# Patient Record
Sex: Male | Born: 1970
Health system: Southern US, Community
[De-identification: ages and names within clinical notes are randomized; demographics above are authoritative.]

## PROBLEM LIST (undated history)

## (undated) DIAGNOSIS — J189 Pneumonia, unspecified organism: Secondary | ICD-10-CM

## (undated) DIAGNOSIS — S065X9A Traumatic subdural hemorrhage with loss of consciousness of unspecified duration, initial encounter: Secondary | ICD-10-CM

## (undated) DIAGNOSIS — F1110 Opioid abuse, uncomplicated: Secondary | ICD-10-CM

## (undated) DIAGNOSIS — Z9289 Personal history of other medical treatment: Secondary | ICD-10-CM

## (undated) DIAGNOSIS — S065XAA Traumatic subdural hemorrhage with loss of consciousness status unknown, initial encounter: Secondary | ICD-10-CM

## (undated) DIAGNOSIS — K922 Gastrointestinal hemorrhage, unspecified: Secondary | ICD-10-CM

## (undated) DIAGNOSIS — J45909 Unspecified asthma, uncomplicated: Secondary | ICD-10-CM

## (undated) DIAGNOSIS — D649 Anemia, unspecified: Secondary | ICD-10-CM

## (undated) DIAGNOSIS — N200 Calculus of kidney: Secondary | ICD-10-CM

## (undated) DIAGNOSIS — M549 Dorsalgia, unspecified: Secondary | ICD-10-CM

## (undated) DIAGNOSIS — G8929 Other chronic pain: Secondary | ICD-10-CM

## (undated) DIAGNOSIS — L409 Psoriasis, unspecified: Secondary | ICD-10-CM

## (undated) DIAGNOSIS — Z8719 Personal history of other diseases of the digestive system: Secondary | ICD-10-CM

## (undated) DIAGNOSIS — Z87442 Personal history of urinary calculi: Secondary | ICD-10-CM

## (undated) DIAGNOSIS — J939 Pneumothorax, unspecified: Secondary | ICD-10-CM

## (undated) DIAGNOSIS — M1990 Unspecified osteoarthritis, unspecified site: Secondary | ICD-10-CM

## (undated) DIAGNOSIS — F1410 Cocaine abuse, uncomplicated: Secondary | ICD-10-CM

## (undated) DIAGNOSIS — F419 Anxiety disorder, unspecified: Secondary | ICD-10-CM

## (undated) DIAGNOSIS — K219 Gastro-esophageal reflux disease without esophagitis: Secondary | ICD-10-CM

## (undated) HISTORY — PX: LEG SURGERY: SHX1003

## (undated) HISTORY — PX: ORIF ELBOW FRACTURE: SUR928

## (undated) HISTORY — PX: ORIF CLAVICULAR FRACTURE: SHX5055

## (undated) HISTORY — PX: TIBIA FRACTURE SURGERY: SHX806

## (undated) HISTORY — PX: JOINT REPLACEMENT: SHX530

## (undated) HISTORY — PX: COLONOSCOPY WITH ESOPHAGOGASTRODUODENOSCOPY (EGD): SHX5779

## (undated) HISTORY — PX: CARPAL TUNNEL RELEASE: SHX101

---

## 1998-12-30 DIAGNOSIS — J939 Pneumothorax, unspecified: Secondary | ICD-10-CM

## 1998-12-30 DIAGNOSIS — IMO0002 Reserved for concepts with insufficient information to code with codable children: Secondary | ICD-10-CM | POA: Insufficient documentation

## 1998-12-30 HISTORY — DX: Pneumothorax, unspecified: J93.9

## 1999-06-23 ENCOUNTER — Encounter: Payer: Self-pay | Admitting: Emergency Medicine

## 1999-06-23 ENCOUNTER — Encounter: Payer: Self-pay | Admitting: General Surgery

## 1999-06-23 ENCOUNTER — Inpatient Hospital Stay (HOSPITAL_COMMUNITY): Admission: EM | Admit: 1999-06-23 | Discharge: 1999-07-24 | Payer: Self-pay | Admitting: Emergency Medicine

## 1999-06-24 ENCOUNTER — Encounter: Payer: Self-pay | Admitting: Orthopedic Surgery

## 1999-06-24 ENCOUNTER — Encounter: Payer: Self-pay | Admitting: General Surgery

## 1999-06-25 ENCOUNTER — Encounter: Payer: Self-pay | Admitting: General Surgery

## 1999-06-26 ENCOUNTER — Encounter: Payer: Self-pay | Admitting: General Surgery

## 1999-06-27 ENCOUNTER — Encounter: Payer: Self-pay | Admitting: General Surgery

## 1999-06-28 ENCOUNTER — Encounter: Payer: Self-pay | Admitting: General Surgery

## 1999-06-29 ENCOUNTER — Encounter: Payer: Self-pay | Admitting: General Surgery

## 1999-07-01 ENCOUNTER — Encounter: Payer: Self-pay | Admitting: General Surgery

## 1999-07-02 ENCOUNTER — Encounter: Payer: Self-pay | Admitting: General Surgery

## 1999-07-03 ENCOUNTER — Encounter: Payer: Self-pay | Admitting: General Surgery

## 1999-07-04 ENCOUNTER — Encounter: Payer: Self-pay | Admitting: General Surgery

## 1999-07-05 ENCOUNTER — Encounter: Payer: Self-pay | Admitting: General Surgery

## 1999-07-06 ENCOUNTER — Encounter: Payer: Self-pay | Admitting: General Surgery

## 1999-07-07 ENCOUNTER — Encounter: Payer: Self-pay | Admitting: General Surgery

## 1999-07-08 ENCOUNTER — Encounter: Payer: Self-pay | Admitting: Thoracic Surgery (Cardiothoracic Vascular Surgery)

## 1999-07-09 ENCOUNTER — Encounter: Payer: Self-pay | Admitting: Thoracic Surgery (Cardiothoracic Vascular Surgery)

## 1999-07-10 ENCOUNTER — Encounter: Payer: Self-pay | Admitting: Thoracic Surgery (Cardiothoracic Vascular Surgery)

## 1999-07-11 ENCOUNTER — Encounter: Payer: Self-pay | Admitting: Thoracic Surgery (Cardiothoracic Vascular Surgery)

## 1999-07-12 ENCOUNTER — Encounter: Payer: Self-pay | Admitting: General Surgery

## 1999-07-14 ENCOUNTER — Encounter: Payer: Self-pay | Admitting: General Surgery

## 1999-07-15 ENCOUNTER — Encounter: Payer: Self-pay | Admitting: Surgery

## 1999-07-16 ENCOUNTER — Encounter: Payer: Self-pay | Admitting: General Surgery

## 1999-07-16 ENCOUNTER — Encounter: Payer: Self-pay | Admitting: Orthopedic Surgery

## 1999-07-22 ENCOUNTER — Encounter: Payer: Self-pay | Admitting: Orthopedic Surgery

## 1999-07-24 ENCOUNTER — Encounter: Payer: Self-pay | Admitting: General Surgery

## 1999-07-30 ENCOUNTER — Ambulatory Visit (HOSPITAL_COMMUNITY): Admission: RE | Admit: 1999-07-30 | Discharge: 1999-07-30 | Payer: Self-pay

## 1999-08-08 ENCOUNTER — Encounter: Admission: RE | Admit: 1999-08-08 | Discharge: 1999-09-27 | Payer: Self-pay | Admitting: Orthopedic Surgery

## 1999-09-10 ENCOUNTER — Encounter: Payer: Self-pay | Admitting: Orthopedic Surgery

## 1999-09-11 ENCOUNTER — Encounter: Payer: Self-pay | Admitting: Orthopedic Surgery

## 1999-09-11 ENCOUNTER — Ambulatory Visit (HOSPITAL_COMMUNITY): Admission: RE | Admit: 1999-09-11 | Discharge: 1999-09-11 | Payer: Self-pay | Admitting: Orthopedic Surgery

## 1999-11-27 ENCOUNTER — Encounter: Payer: Self-pay | Admitting: General Surgery

## 1999-11-27 ENCOUNTER — Ambulatory Visit (HOSPITAL_COMMUNITY): Admission: RE | Admit: 1999-11-27 | Discharge: 1999-11-27 | Payer: Self-pay | Admitting: General Surgery

## 2000-02-19 ENCOUNTER — Encounter: Admission: RE | Admit: 2000-02-19 | Discharge: 2000-05-19 | Payer: Self-pay | Admitting: Orthopedic Surgery

## 2000-04-22 ENCOUNTER — Emergency Department (HOSPITAL_COMMUNITY): Admission: EM | Admit: 2000-04-22 | Discharge: 2000-04-22 | Payer: Self-pay | Admitting: Emergency Medicine

## 2000-04-22 ENCOUNTER — Encounter: Payer: Self-pay | Admitting: Emergency Medicine

## 2000-06-10 ENCOUNTER — Ambulatory Visit (HOSPITAL_BASED_OUTPATIENT_CLINIC_OR_DEPARTMENT_OTHER): Admission: RE | Admit: 2000-06-10 | Discharge: 2000-06-10 | Payer: Self-pay | Admitting: Pediatrics

## 2001-01-06 ENCOUNTER — Encounter: Payer: Self-pay | Admitting: Emergency Medicine

## 2001-01-06 ENCOUNTER — Emergency Department (HOSPITAL_COMMUNITY): Admission: EM | Admit: 2001-01-06 | Discharge: 2001-01-06 | Payer: Self-pay | Admitting: Emergency Medicine

## 2001-09-19 ENCOUNTER — Ambulatory Visit (HOSPITAL_COMMUNITY): Admission: RE | Admit: 2001-09-19 | Discharge: 2001-09-19 | Payer: Self-pay | Admitting: Orthopedic Surgery

## 2001-09-19 ENCOUNTER — Encounter: Payer: Self-pay | Admitting: Orthopedic Surgery

## 2002-06-29 ENCOUNTER — Inpatient Hospital Stay (HOSPITAL_COMMUNITY): Admission: RE | Admit: 2002-06-29 | Discharge: 2002-07-01 | Payer: Self-pay | Admitting: Orthopedic Surgery

## 2003-05-29 ENCOUNTER — Emergency Department (HOSPITAL_COMMUNITY): Admission: EM | Admit: 2003-05-29 | Discharge: 2003-05-29 | Payer: Self-pay | Admitting: Emergency Medicine

## 2003-12-31 DIAGNOSIS — Z96659 Presence of unspecified artificial knee joint: Secondary | ICD-10-CM | POA: Insufficient documentation

## 2004-02-06 ENCOUNTER — Emergency Department (HOSPITAL_COMMUNITY): Admission: EM | Admit: 2004-02-06 | Discharge: 2004-02-06 | Payer: Self-pay | Admitting: Emergency Medicine

## 2004-02-16 ENCOUNTER — Inpatient Hospital Stay (HOSPITAL_COMMUNITY): Admission: RE | Admit: 2004-02-16 | Discharge: 2004-02-20 | Payer: Self-pay | Admitting: Orthopedic Surgery

## 2004-10-29 ENCOUNTER — Emergency Department (HOSPITAL_COMMUNITY): Admission: EM | Admit: 2004-10-29 | Discharge: 2004-10-29 | Payer: Self-pay | Admitting: Family Medicine

## 2004-11-07 ENCOUNTER — Emergency Department (HOSPITAL_COMMUNITY): Admission: EM | Admit: 2004-11-07 | Discharge: 2004-11-07 | Payer: Self-pay | Admitting: Emergency Medicine

## 2004-11-11 ENCOUNTER — Emergency Department (HOSPITAL_COMMUNITY): Admission: EM | Admit: 2004-11-11 | Discharge: 2004-11-11 | Payer: Self-pay | Admitting: Emergency Medicine

## 2004-12-02 ENCOUNTER — Emergency Department (HOSPITAL_COMMUNITY): Admission: EM | Admit: 2004-12-02 | Discharge: 2004-12-02 | Payer: Self-pay

## 2004-12-04 ENCOUNTER — Emergency Department (HOSPITAL_COMMUNITY): Admission: EM | Admit: 2004-12-04 | Discharge: 2004-12-04 | Payer: Self-pay | Admitting: Emergency Medicine

## 2004-12-06 ENCOUNTER — Emergency Department (HOSPITAL_COMMUNITY): Admission: EM | Admit: 2004-12-06 | Discharge: 2004-12-07 | Payer: Self-pay | Admitting: Emergency Medicine

## 2004-12-07 ENCOUNTER — Emergency Department (HOSPITAL_COMMUNITY): Admission: EM | Admit: 2004-12-07 | Discharge: 2004-12-07 | Payer: Self-pay | Admitting: Emergency Medicine

## 2004-12-20 ENCOUNTER — Emergency Department (HOSPITAL_COMMUNITY): Admission: EM | Admit: 2004-12-20 | Discharge: 2004-12-20 | Payer: Self-pay | Admitting: Emergency Medicine

## 2005-01-07 ENCOUNTER — Ambulatory Visit: Payer: Self-pay | Admitting: Orthopedic Surgery

## 2005-03-10 ENCOUNTER — Emergency Department (HOSPITAL_COMMUNITY): Admission: EM | Admit: 2005-03-10 | Discharge: 2005-03-10 | Payer: Self-pay | Admitting: Emergency Medicine

## 2005-03-21 ENCOUNTER — Ambulatory Visit: Payer: Self-pay | Admitting: Orthopedic Surgery

## 2005-03-25 ENCOUNTER — Encounter
Admission: RE | Admit: 2005-03-25 | Discharge: 2005-06-23 | Payer: Self-pay | Admitting: Physical Medicine & Rehabilitation

## 2005-04-03 ENCOUNTER — Emergency Department (HOSPITAL_COMMUNITY): Admission: EM | Admit: 2005-04-03 | Discharge: 2005-04-03 | Payer: Self-pay | Admitting: Emergency Medicine

## 2005-04-07 ENCOUNTER — Emergency Department (HOSPITAL_COMMUNITY): Admission: EM | Admit: 2005-04-07 | Discharge: 2005-04-07 | Payer: Self-pay | Admitting: Emergency Medicine

## 2005-06-13 ENCOUNTER — Emergency Department (HOSPITAL_COMMUNITY): Admission: EM | Admit: 2005-06-13 | Discharge: 2005-06-13 | Payer: Self-pay | Admitting: *Deleted

## 2005-10-15 ENCOUNTER — Emergency Department (HOSPITAL_COMMUNITY): Admission: EM | Admit: 2005-10-15 | Discharge: 2005-10-15 | Payer: Self-pay | Admitting: Emergency Medicine

## 2006-03-03 ENCOUNTER — Emergency Department (HOSPITAL_COMMUNITY): Admission: EM | Admit: 2006-03-03 | Discharge: 2006-03-03 | Payer: Self-pay | Admitting: Emergency Medicine

## 2006-06-04 ENCOUNTER — Inpatient Hospital Stay (HOSPITAL_COMMUNITY): Admission: EM | Admit: 2006-06-04 | Discharge: 2006-06-09 | Payer: Self-pay | Admitting: Psychiatry

## 2006-06-04 ENCOUNTER — Ambulatory Visit: Payer: Self-pay | Admitting: Psychiatry

## 2006-08-01 ENCOUNTER — Emergency Department (HOSPITAL_COMMUNITY): Admission: EM | Admit: 2006-08-01 | Discharge: 2006-08-02 | Payer: Self-pay | Admitting: Emergency Medicine

## 2006-11-03 ENCOUNTER — Emergency Department (HOSPITAL_COMMUNITY): Admission: EM | Admit: 2006-11-03 | Discharge: 2006-11-04 | Payer: Self-pay | Admitting: Emergency Medicine

## 2008-07-26 ENCOUNTER — Emergency Department (HOSPITAL_COMMUNITY): Admission: EM | Admit: 2008-07-26 | Discharge: 2008-07-26 | Payer: Self-pay | Admitting: Emergency Medicine

## 2008-08-01 ENCOUNTER — Emergency Department (HOSPITAL_COMMUNITY): Admission: EM | Admit: 2008-08-01 | Discharge: 2008-08-01 | Payer: Self-pay | Admitting: Emergency Medicine

## 2008-08-12 ENCOUNTER — Ambulatory Visit: Payer: Self-pay | Admitting: Nurse Practitioner

## 2008-08-12 DIAGNOSIS — Z9889 Other specified postprocedural states: Secondary | ICD-10-CM | POA: Insufficient documentation

## 2008-08-12 DIAGNOSIS — K219 Gastro-esophageal reflux disease without esophagitis: Secondary | ICD-10-CM

## 2008-08-12 DIAGNOSIS — M25569 Pain in unspecified knee: Secondary | ICD-10-CM

## 2008-08-12 DIAGNOSIS — L259 Unspecified contact dermatitis, unspecified cause: Secondary | ICD-10-CM

## 2008-08-12 DIAGNOSIS — J45909 Unspecified asthma, uncomplicated: Secondary | ICD-10-CM | POA: Insufficient documentation

## 2008-08-13 ENCOUNTER — Encounter (INDEPENDENT_AMBULATORY_CARE_PROVIDER_SITE_OTHER): Payer: Self-pay | Admitting: Nurse Practitioner

## 2008-08-14 ENCOUNTER — Emergency Department (HOSPITAL_COMMUNITY): Admission: EM | Admit: 2008-08-14 | Discharge: 2008-08-14 | Payer: Self-pay | Admitting: Emergency Medicine

## 2008-08-15 ENCOUNTER — Ambulatory Visit: Payer: Self-pay | Admitting: *Deleted

## 2008-08-15 ENCOUNTER — Encounter (INDEPENDENT_AMBULATORY_CARE_PROVIDER_SITE_OTHER): Payer: Self-pay | Admitting: Nurse Practitioner

## 2008-08-15 LAB — CONVERTED CEMR LAB
Albumin: 4.6 g/dL (ref 3.5–5.2)
CO2: 24 meq/L (ref 19–32)
Chloride: 103 meq/L (ref 96–112)
Glucose, Bld: 92 mg/dL (ref 70–99)
HCT: 49.1 % (ref 39.0–52.0)
Lymphocytes Relative: 29 % (ref 12–46)
Lymphs Abs: 2.1 10*3/uL (ref 0.7–4.0)
Neutrophils Relative %: 62 % (ref 43–77)
Platelets: 313 10*3/uL (ref 150–400)
Potassium: 4.6 meq/L (ref 3.5–5.3)
Sodium: 141 meq/L (ref 135–145)
TSH: 0.326 microintl units/mL — ABNORMAL LOW (ref 0.350–4.50)
Total Protein: 7.7 g/dL (ref 6.0–8.3)
WBC: 7.2 10*3/uL (ref 4.0–10.5)

## 2008-08-16 ENCOUNTER — Telehealth (INDEPENDENT_AMBULATORY_CARE_PROVIDER_SITE_OTHER): Payer: Self-pay | Admitting: Nurse Practitioner

## 2008-08-16 LAB — CONVERTED CEMR LAB: Free T4: 1.3 ng/dL (ref 0.89–1.80)

## 2008-08-23 ENCOUNTER — Ambulatory Visit: Payer: Self-pay | Admitting: Internal Medicine

## 2008-08-23 DIAGNOSIS — Z87442 Personal history of urinary calculi: Secondary | ICD-10-CM

## 2008-08-23 DIAGNOSIS — R634 Abnormal weight loss: Secondary | ICD-10-CM

## 2008-08-23 DIAGNOSIS — M1990 Unspecified osteoarthritis, unspecified site: Secondary | ICD-10-CM | POA: Insufficient documentation

## 2008-08-23 LAB — CONVERTED CEMR LAB
Alkaline Phosphatase: 36 units/L — ABNORMAL LOW (ref 39–117)
Bilirubin, Direct: 0.1 mg/dL (ref 0.0–0.3)
GFR calc Af Amer: 97 mL/min
Glucose, Bld: 95 mg/dL (ref 70–99)
Lymphocytes Relative: 36.8 % (ref 12.0–46.0)
Monocytes Absolute: 0.6 10*3/uL (ref 0.1–1.0)
Monocytes Relative: 9.4 % (ref 3.0–12.0)
Platelets: 371 10*3/uL (ref 150–400)
Potassium: 3.7 meq/L (ref 3.5–5.1)
RDW: 12.1 % (ref 11.5–14.6)
Sodium: 142 meq/L (ref 135–145)
Total Bilirubin: 0.7 mg/dL (ref 0.3–1.2)
Total Protein: 7.9 g/dL (ref 6.0–8.3)

## 2008-08-25 ENCOUNTER — Encounter: Payer: Self-pay | Admitting: Internal Medicine

## 2008-08-25 ENCOUNTER — Encounter (INDEPENDENT_AMBULATORY_CARE_PROVIDER_SITE_OTHER): Payer: Self-pay | Admitting: Nurse Practitioner

## 2008-08-28 ENCOUNTER — Emergency Department (HOSPITAL_COMMUNITY): Admission: EM | Admit: 2008-08-28 | Discharge: 2008-08-28 | Payer: Self-pay | Admitting: Emergency Medicine

## 2008-09-04 ENCOUNTER — Ambulatory Visit: Payer: Self-pay | Admitting: *Deleted

## 2008-09-04 ENCOUNTER — Emergency Department (HOSPITAL_COMMUNITY): Admission: EM | Admit: 2008-09-04 | Discharge: 2008-09-04 | Payer: Self-pay | Admitting: Emergency Medicine

## 2008-09-04 ENCOUNTER — Inpatient Hospital Stay (HOSPITAL_COMMUNITY): Admission: AD | Admit: 2008-09-04 | Discharge: 2008-09-08 | Payer: Self-pay | Admitting: *Deleted

## 2008-09-09 ENCOUNTER — Ambulatory Visit: Payer: Self-pay | Admitting: Internal Medicine

## 2008-09-09 ENCOUNTER — Telehealth (INDEPENDENT_AMBULATORY_CARE_PROVIDER_SITE_OTHER): Payer: Self-pay | Admitting: *Deleted

## 2008-10-03 ENCOUNTER — Telehealth: Payer: Self-pay | Admitting: Internal Medicine

## 2008-10-19 ENCOUNTER — Ambulatory Visit: Payer: Self-pay | Admitting: Family Medicine

## 2008-10-19 DIAGNOSIS — J209 Acute bronchitis, unspecified: Secondary | ICD-10-CM

## 2008-11-28 ENCOUNTER — Telehealth: Payer: Self-pay | Admitting: Internal Medicine

## 2008-12-05 ENCOUNTER — Ambulatory Visit: Payer: Self-pay | Admitting: Internal Medicine

## 2008-12-12 ENCOUNTER — Emergency Department (HOSPITAL_COMMUNITY): Admission: EM | Admit: 2008-12-12 | Discharge: 2008-12-12 | Payer: Self-pay | Admitting: Emergency Medicine

## 2009-01-26 ENCOUNTER — Ambulatory Visit: Payer: Self-pay | Admitting: Internal Medicine

## 2009-01-26 DIAGNOSIS — F319 Bipolar disorder, unspecified: Secondary | ICD-10-CM

## 2009-02-09 ENCOUNTER — Telehealth: Payer: Self-pay | Admitting: Internal Medicine

## 2009-02-15 ENCOUNTER — Ambulatory Visit: Payer: Self-pay | Admitting: Internal Medicine

## 2009-02-22 ENCOUNTER — Telehealth: Payer: Self-pay | Admitting: Internal Medicine

## 2009-02-22 ENCOUNTER — Ambulatory Visit: Payer: Self-pay | Admitting: Diagnostic Radiology

## 2009-02-22 ENCOUNTER — Emergency Department (HOSPITAL_BASED_OUTPATIENT_CLINIC_OR_DEPARTMENT_OTHER): Admission: EM | Admit: 2009-02-22 | Discharge: 2009-02-22 | Payer: Self-pay | Admitting: Emergency Medicine

## 2009-02-27 ENCOUNTER — Telehealth: Payer: Self-pay | Admitting: Internal Medicine

## 2009-03-21 ENCOUNTER — Telehealth: Payer: Self-pay | Admitting: Internal Medicine

## 2009-04-09 ENCOUNTER — Emergency Department (HOSPITAL_COMMUNITY): Admission: EM | Admit: 2009-04-09 | Discharge: 2009-04-09 | Payer: Self-pay | Admitting: Emergency Medicine

## 2009-04-10 ENCOUNTER — Ambulatory Visit: Payer: Self-pay | Admitting: Internal Medicine

## 2009-04-22 ENCOUNTER — Emergency Department (HOSPITAL_COMMUNITY): Admission: EM | Admit: 2009-04-22 | Discharge: 2009-04-23 | Payer: Self-pay | Admitting: Emergency Medicine

## 2009-04-25 ENCOUNTER — Inpatient Hospital Stay (HOSPITAL_COMMUNITY): Admission: EM | Admit: 2009-04-25 | Discharge: 2009-04-26 | Payer: Self-pay | Admitting: *Deleted

## 2009-04-25 ENCOUNTER — Ambulatory Visit: Payer: Self-pay | Admitting: Internal Medicine

## 2009-05-01 ENCOUNTER — Emergency Department (HOSPITAL_COMMUNITY): Admission: EM | Admit: 2009-05-01 | Discharge: 2009-05-01 | Payer: Self-pay | Admitting: Emergency Medicine

## 2009-06-22 ENCOUNTER — Ambulatory Visit: Payer: Self-pay | Admitting: Internal Medicine

## 2009-06-22 DIAGNOSIS — F329 Major depressive disorder, single episode, unspecified: Secondary | ICD-10-CM

## 2009-07-01 ENCOUNTER — Emergency Department (HOSPITAL_BASED_OUTPATIENT_CLINIC_OR_DEPARTMENT_OTHER): Admission: EM | Admit: 2009-07-01 | Discharge: 2009-07-01 | Payer: Self-pay | Admitting: Emergency Medicine

## 2009-07-05 ENCOUNTER — Emergency Department (HOSPITAL_BASED_OUTPATIENT_CLINIC_OR_DEPARTMENT_OTHER): Admission: EM | Admit: 2009-07-05 | Discharge: 2009-07-05 | Payer: Self-pay | Admitting: Emergency Medicine

## 2009-07-26 ENCOUNTER — Telehealth: Payer: Self-pay | Admitting: Internal Medicine

## 2009-07-28 ENCOUNTER — Emergency Department: Payer: Self-pay | Admitting: Emergency Medicine

## 2009-07-31 ENCOUNTER — Ambulatory Visit: Payer: Self-pay | Admitting: Internal Medicine

## 2009-08-03 ENCOUNTER — Telehealth: Payer: Self-pay | Admitting: Internal Medicine

## 2009-08-04 ENCOUNTER — Telehealth: Payer: Self-pay | Admitting: Internal Medicine

## 2009-08-08 ENCOUNTER — Encounter: Payer: Self-pay | Admitting: Internal Medicine

## 2009-08-10 ENCOUNTER — Telehealth: Payer: Self-pay | Admitting: Internal Medicine

## 2009-08-25 ENCOUNTER — Telehealth: Payer: Self-pay | Admitting: Internal Medicine

## 2009-08-27 ENCOUNTER — Emergency Department (HOSPITAL_COMMUNITY): Admission: EM | Admit: 2009-08-27 | Discharge: 2009-08-27 | Payer: Self-pay | Admitting: Emergency Medicine

## 2009-09-08 ENCOUNTER — Emergency Department (HOSPITAL_BASED_OUTPATIENT_CLINIC_OR_DEPARTMENT_OTHER): Admission: EM | Admit: 2009-09-08 | Discharge: 2009-09-08 | Payer: Self-pay | Admitting: Emergency Medicine

## 2009-09-08 ENCOUNTER — Telehealth: Payer: Self-pay | Admitting: Internal Medicine

## 2009-09-08 ENCOUNTER — Ambulatory Visit: Payer: Self-pay | Admitting: Diagnostic Radiology

## 2009-09-08 ENCOUNTER — Encounter (INDEPENDENT_AMBULATORY_CARE_PROVIDER_SITE_OTHER): Payer: Self-pay | Admitting: *Deleted

## 2009-09-14 ENCOUNTER — Telehealth (INDEPENDENT_AMBULATORY_CARE_PROVIDER_SITE_OTHER): Payer: Self-pay | Admitting: *Deleted

## 2009-09-18 ENCOUNTER — Telehealth (INDEPENDENT_AMBULATORY_CARE_PROVIDER_SITE_OTHER): Payer: Self-pay | Admitting: *Deleted

## 2009-10-02 ENCOUNTER — Telehealth (INDEPENDENT_AMBULATORY_CARE_PROVIDER_SITE_OTHER): Payer: Self-pay | Admitting: *Deleted

## 2009-11-05 ENCOUNTER — Emergency Department (HOSPITAL_COMMUNITY): Admission: EM | Admit: 2009-11-05 | Discharge: 2009-11-05 | Payer: Self-pay | Admitting: Emergency Medicine

## 2009-11-10 ENCOUNTER — Emergency Department (HOSPITAL_BASED_OUTPATIENT_CLINIC_OR_DEPARTMENT_OTHER): Admission: EM | Admit: 2009-11-10 | Discharge: 2009-11-10 | Payer: Self-pay | Admitting: Emergency Medicine

## 2009-11-10 ENCOUNTER — Ambulatory Visit: Payer: Self-pay | Admitting: Diagnostic Radiology
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
## 2009-11-13 ENCOUNTER — Emergency Department (HOSPITAL_COMMUNITY): Admission: EM | Admit: 2009-11-13 | Discharge: 2009-11-13 | Payer: Self-pay | Admitting: Emergency Medicine

## 2009-12-01 ENCOUNTER — Emergency Department (HOSPITAL_COMMUNITY): Admission: EM | Admit: 2009-12-01 | Discharge: 2009-12-02 | Payer: Self-pay | Admitting: Emergency Medicine

## 2009-12-30 ENCOUNTER — Emergency Department (HOSPITAL_COMMUNITY): Admission: EM | Admit: 2009-12-30 | Discharge: 2009-12-31 | Payer: Self-pay | Admitting: Emergency Medicine

## 2010-01-30 ENCOUNTER — Emergency Department (HOSPITAL_BASED_OUTPATIENT_CLINIC_OR_DEPARTMENT_OTHER): Admission: EM | Admit: 2010-01-30 | Discharge: 2010-01-30 | Payer: Self-pay | Admitting: Emergency Medicine

## 2010-02-20 ENCOUNTER — Emergency Department (HOSPITAL_COMMUNITY): Admission: EM | Admit: 2010-02-20 | Discharge: 2010-02-20 | Payer: Self-pay | Admitting: Emergency Medicine

## 2010-02-26 ENCOUNTER — Ambulatory Visit: Payer: Self-pay | Admitting: Diagnostic Radiology

## 2010-02-26 ENCOUNTER — Emergency Department (HOSPITAL_BASED_OUTPATIENT_CLINIC_OR_DEPARTMENT_OTHER): Admission: EM | Admit: 2010-02-26 | Discharge: 2010-02-26 | Payer: Self-pay | Admitting: Emergency Medicine

## 2010-08-12 ENCOUNTER — Emergency Department (HOSPITAL_COMMUNITY): Admission: EM | Admit: 2010-08-12 | Discharge: 2010-08-12 | Payer: Self-pay | Admitting: Emergency Medicine

## 2010-08-17 ENCOUNTER — Ambulatory Visit (HOSPITAL_COMMUNITY): Admission: RE | Admit: 2010-08-17 | Discharge: 2010-08-17 | Payer: Self-pay | Admitting: Plastic Surgery

## 2010-10-09 ENCOUNTER — Ambulatory Visit (HOSPITAL_COMMUNITY): Admission: RE | Admit: 2010-10-09 | Discharge: 2010-10-09 | Payer: Self-pay | Admitting: Plastic Surgery

## 2010-12-04 ENCOUNTER — Ambulatory Visit (HOSPITAL_COMMUNITY)
Admission: RE | Admit: 2010-12-04 | Discharge: 2010-12-04 | Payer: Self-pay | Source: Home / Self Care | Admitting: Plastic Surgery

## 2011-01-21 ENCOUNTER — Encounter: Payer: Self-pay | Admitting: Internal Medicine

## 2011-03-14 LAB — SURGICAL PCR SCREEN
MRSA, PCR: NEGATIVE
Staphylococcus aureus: NEGATIVE

## 2011-03-14 LAB — CBC
Hemoglobin: 14 g/dL (ref 13.0–17.0)
MCH: 30.8 pg (ref 26.0–34.0)
RBC: 4.55 MIL/uL (ref 4.22–5.81)
WBC: 5.1 10*3/uL (ref 4.0–10.5)

## 2011-04-10 LAB — CBC
HCT: 44.7 % (ref 39.0–52.0)
Hemoglobin: 15.4 g/dL (ref 13.0–17.0)
RBC: 4.91 MIL/uL (ref 4.22–5.81)
WBC: 19.5 10*3/uL — ABNORMAL HIGH (ref 4.0–10.5)

## 2011-04-10 LAB — POCT CARDIAC MARKERS
CKMB, poc: <1 ng/mL — ABNORMAL LOW (ref 1.0–8.0)
Myoglobin, poc: 22 ng/mL (ref 12–200)
Myoglobin, poc: 27.8 ng/mL (ref 12–200)

## 2011-04-10 LAB — URINE CULTURE

## 2011-04-10 LAB — DIFFERENTIAL
Eosinophils Relative: 0 % (ref 0–5)
Lymphocytes Relative: 17 % (ref 12–46)
Lymphs Abs: 3.4 10*3/uL (ref 0.7–4.0)
Monocytes Absolute: 1.4 10*3/uL — ABNORMAL HIGH (ref 0.1–1.0)
Monocytes Relative: 7 % (ref 3–12)
Neutro Abs: 14.5 10*3/uL — ABNORMAL HIGH (ref 1.7–7.7)

## 2011-04-10 LAB — CARDIAC PANEL(CRET KIN+CKTOT+MB+TROPI)
CK, MB: 0.9 ng/mL (ref 0.3–4.0)
CK, MB: 1 ng/mL (ref 0.3–4.0)
CK, MB: 1.2 ng/mL (ref 0.3–4.0)
Relative Index: INVALID (ref 0.0–2.5)
Relative Index: INVALID (ref 0.0–2.5)
Total CK: 58 U/L (ref 7–232)
Total CK: 69 U/L (ref 7–232)
Total CK: 71 U/L (ref 7–232)
Troponin I: 0.01 ng/mL (ref 0.00–0.06)
Troponin I: <0.01 ng/mL (ref 0.00–0.06)

## 2011-04-10 LAB — LIPID PANEL: VLDL: 10 mg/dL (ref 0–40)

## 2011-04-10 LAB — PROTIME-INR
INR: 1.1 (ref 0.00–1.49)
Prothrombin Time: 14.2 seconds (ref 11.6–15.2)

## 2011-04-10 LAB — URINALYSIS, ROUTINE W REFLEX MICROSCOPIC
Bilirubin Urine: NEGATIVE
Glucose, UA: 500 mg/dL — AB
Hgb urine dipstick: NEGATIVE
Specific Gravity, Urine: 1.01 (ref 1.005–1.030)
Urobilinogen, UA: 0.2 mg/dL (ref 0.0–1.0)

## 2011-04-10 LAB — URINE DRUGS OF ABUSE SCREEN W ALC, ROUTINE (REF LAB)
Barbiturate Quant, Ur: NEGATIVE
Creatinine,U: 26.9 mg/dL
Marijuana Metabolite: NEGATIVE
Methadone: NEGATIVE
Opiate Screen, Urine: NEGATIVE

## 2011-04-10 LAB — BENZODIAZEPINE, QUANTITATIVE, URINE
Alprazolam (GC/LC/MS), ur confirm: NEGATIVE
Nordiazepam GC/MS Conf: NEGATIVE
Oxazepam GC/MS Conf: 340 ng/mL

## 2011-04-10 LAB — POCT I-STAT, CHEM 8
BUN: <3 mg/dL — ABNORMAL LOW (ref 6–23)
Creatinine, Ser: 0.9 mg/dL (ref 0.4–1.5)
Potassium: 3 mEq/L — ABNORMAL LOW (ref 3.5–5.1)
Sodium: 138 mEq/L (ref 135–145)

## 2011-04-10 LAB — BRAIN NATRIURETIC PEPTIDE: Pro B Natriuretic peptide (BNP): <30 pg/mL (ref 0.0–100.0)

## 2011-04-10 LAB — HEMOGLOBIN A1C: Hgb A1c MFr Bld: 5.5 % (ref 4.6–6.1)

## 2011-04-16 LAB — DIFFERENTIAL
Eosinophils Absolute: 0.1 10*3/uL (ref 0.0–0.7)
Eosinophils Relative: 0 % (ref 0–5)
Lymphocytes Relative: 16 % (ref 12–46)
Lymphs Abs: 2.9 10*3/uL (ref 0.7–4.0)
Monocytes Absolute: 1.4 10*3/uL — ABNORMAL HIGH (ref 0.1–1.0)

## 2011-04-16 LAB — CBC
Platelets: 419 10*3/uL — ABNORMAL HIGH (ref 150–400)
RBC: 4.75 MIL/uL (ref 4.22–5.81)
WBC: 17.6 10*3/uL — ABNORMAL HIGH (ref 4.0–10.5)

## 2011-04-16 LAB — COMPREHENSIVE METABOLIC PANEL
ALT: 12 U/L (ref 0–53)
AST: 27 U/L (ref 0–37)
Albumin: 4.7 g/dL (ref 3.5–5.2)
CO2: 26 mEq/L (ref 19–32)
Calcium: 10.1 mg/dL (ref 8.4–10.5)
Chloride: 99 mEq/L (ref 96–112)
GFR calc Af Amer: >60 mL/min (ref >60)
GFR calc non Af Amer: >60 mL/min (ref >60)
Sodium: 141 mEq/L (ref 135–145)
Total Bilirubin: 0.9 mg/dL (ref 0.3–1.2)

## 2011-05-14 NOTE — H&P (Signed)
Samuel Barrett, MCCORD NO.:  [id number]   MEDICAL RECORD NO.:  [id number]          PATIENT TYPE:  IPS   LOCATION:  0300                          [facility]ACILITY[facility]  [facility]   PHYSICIAN:  Jasmine Pang, M.D. DATE OF BIRTH:  [birth year]   DATE OF ADMISSION:  09/04/2008  DATE OF DISCHARGE:                       PSYCHIATRIC ADMISSION ASSESSMENT   IDENTIFYING INFORMATION:  This is a 40 year old male voluntarily  admitted on September 04, 2008.   HISTORY OF PRESENT ILLNESS:  The patient presents with a history of  depression.  Was having thoughts about ending it all.  States that he  came this close to killing himself.  Did not disclose any specific  plan.  He reports that everything is affecting him.  He has constant  pain.  He has been drinking once a week.  Also had taken Xanax and  cocaine because he was pissed off.  He has lost weight, reports a 25  pound weight loss over the past 4-5 months.  Reports having trouble  eating and is scheduled to have a workup for his difficulty swallowing  and weight loss.  Also reporting problems with finances, trying to get  disability for his constant knee pain.   PAST PSYCHIATRIC HISTORY:  First admission to the [facility].  Was hospitalized at [facility] approximately 2 years ago where he  was detoxed of cocaine.  He has no current outpatient mental health  treatment.   SOCIAL HISTORY:  A 40 year old male who lives in [place].  He is  single.  He lives with his mother.  He is unemployed.  Trying to get  disability for his knee pain.   FAMILY HISTORY:  None.   ALCOHOL/DRUG HISTORY:  As above.  He has been using alcohol, Xanax and  cocaine.   PRIMARY CARE Lonzy Mato:  Dr. Amador Cunas at [facility].   MEDICAL PROBLEMS:  1. Reports knee replacement in 2005.  2. Asthma.   MEDICATIONS:  1. Albuterol.  2. Nexium.  3. Vicodin.   DRUG ALLERGIES:  1. IBUPROFEN.  2. TORADOL which he reports problems with hives.   PHYSICAL EXAMINATION:  GENERAL:  This is a thin male who was assessed at  [facility] Emergency Department.  Physical exam was reviewed.  The  patient did receive hydrocodone in the emergency room.  VITAL SIGNS:  Temperature 98, 95 heart rate, 18 respirations, blood  pressure is 115/81. He is 5 feet 9 inches tall and 132 pounds.   LABORATORY DATA:  Urine drug screen positive for opiates, positive for  cocaine, positive for benzodiazepines.  Urinalysis negative.  CBC within  normal limits.  Alcohol level was 24, BMET within normal limits.   MENTAL STATUS EXAM:  The patient was resting in his bed.  He was  awakened and came into the consultation room.  Very little eye contact.  Speech is soft and slow.  The patient's mood is depressed and anxious.  The patient's affect is irritable.  Thought processes are coherent.  There is no evidence of any psychotic statements.  No delusional  thinking.  Cognitive function intact.  Memory  appears good.  Judgment  and insight is fair.   AXIS I:  Depressive disorder, not otherwise specified, polysubstance  abuse.  AXIS II:  Deferred.  AXIS III:  Knee pain.  AXIS IV:  Medical problems, economic issues, problems with occupation  and disability.  AXIS V:  Current is 35.   PLAN:  Contract for safety.  Stabilize mood and thinking.  We will  clarify his pain medications.  We will initiate Depakote as the patient  was expressing problems with mood swings and irritability.  Risks and  benefits were discussed.  The patient is agreeable to beginning  medication.  We will have trazodone for sleep.  We will address his  substance use.  Reinforce medication compliance.  Tentative length of  stay at this time is 3-5 days.      Landry Corporal, N.P.      Jasmine Pang, M.D.  Electronically Signed    JO/MEDQ  D:  09/06/2008  T:  09/07/2008  Job:  [id number]
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
## 2011-05-17 NOTE — Discharge Summary (Signed)
NAMEOLEG, OLESON                           ACCOUNT NO.:  [id number]   MEDICAL RECORD NO.:  [id number]                   PATIENT TYPE:  INP   LOCATION:  5018                                 FACILITY:  [facility]   PHYSICIAN:  Deidre Ala, M.D.                 DATE OF BIRTH:  1971-07-25   DATE OF ADMISSION:  02/16/2004  DATE OF DISCHARGE:  02/20/2004                                 DISCHARGE SUMMARY   ADMISSION DIAGNOSIS:  End-stage degenerative joint disease of the right  knee.   DISCHARGE DIAGNOSIS:  End-stage degenerative joint disease of the right  knee, status post right total knee arthroplasty.   SUMMARY:  The patient was brought to the operating room at [facility] on the 17th of February 2005 for a right total knee arthroplasty;  this was done under general anesthesia.  His knee was replaced with a Depuy  LCS rotating stem, cemented components; this was done with a tourniquet time  of 1 hour 2 minutes, his estimated blood loss less than 100 mL.  The wound  was closed over 2 medium Hemovac drains hooked to an Autovac and he was  taken to the recovery room in stable condition.  On postop day 1, the 18th  of February 2005, he was having significant amounts of right knee pain, not  very well-controlled with PCA, and he said it was up to 10/10 pain while  trying to sleep.  He had no nausea.  He was taking p.o. and he had a Foley  still in place.  Vitals were stable, maximum temperature of 100.0.  The  right knee dressing was clean and dry.  The calf was soft and nontender.  He  was neurovascularly intact, distal.  His hemoglobin was 10.8, hematocrit  31.7 and white blood count of 14.1.  His PT was 14.5 with an INR of 1.2 and  serum chemistry was normal except for a low sodium of 134, a low BUN of 4  and a low calcium of 8.2.  We were using Polysporin ointment and eye patch  for his right eye conjunctivitis and drainage of old hemorrhage, some  conjunctival.  He was given  OxyContin 20 mg 1 p.o. q.12 h. to assist with  his pain control; PCA was continued.  Rehab, PT and OT were consulted.  He  was on Coumadin and Lovenox per pharmacy protocol.  He continued IV Ancef  until the Foley was out and the eye drainage had cleared up.  On postop day  2, the 19th of February 2005, his pain was much better controlled.  Between  the OxyContin and the Percocet, he had had to use a lot less of the PCA and  slept a lot better through the night.  The Foley was taken out in the  morning and he was voiding without difficulty.  He had no nausea and  was  taking p.o. well.  Vitals were stable and he was afebrile.  The right knee  wound was benign.  The calf was soft and nontender and he was  neurovascularly intact.  Hemoglobin 9.7, hematocrit 28.3, down from 31.7; PT  19.7 with an INR of 2.1, a rather dramatic change from 1.2; sodium low at  132, glucose elevated at 101 and calcium low at 8.1; remainder of his labs  normal.  The dressing was changed, the drain was discontinued and his PCA  was discontinued.  The IV was capped off with a saline well and he was to  continue with the OxyContin and the Percocet; in addition, Dilaudid 1-3 mg  IV q.3-4 h. for breakthrough pain.  We are anticipating sending him home  with home health PT and home health nurse, home CPM machine, a shower chair  and a 3-in-1 were ordered as per OT.  On postop day 3, he was doing well and  his pain level was decreased and much better controlled.  He was afebrile,  the wound was benign and I planned to continued him on Ancef until his date  of discharge.  On the 21st of February 2005, he had had some moderate knee  pain but appropriate for postop day 4, no nausea.  He was taking p.o. and  voiding without difficulty and was ready to go home.  His vitals were  stable, he was afebrile, he was out ambulating in the hall and his wound was  benign.  There was no active drainage.  Calf was soft and nontender.  He  was  neurovascularly intact.  White count 7.6, hemoglobin 9.0, hematocrit 26.4,  platelet count 311,000.  PT was 16.4 with an INR of 1.5, down from 1.7, and  serum chemistry was normal except for a low potassium of 3.4, a low BUN of 4  and an elevated glucose of 111.  Plan was made to discharge him home.   ACTIVITY:  His activity is weightbearing as tolerated on the right leg,  immobilizer and walker for ambulation until cleared by therapy.   WOUND INSTRUCTIONS:  Wound instructions were to keep clean and dry, he may  shower on Wednesday, the 23rd of February,  dressing changes daily and take  the staples out in 10 days in our office.   DIET:  Diet was a tolerated.   SPECIAL INSTRUCTIONS:  Special instructions were to call for increasing  pain, redness, drainage or bleeding, numbness or tingling, coughing,  shortness of breath, fever greater than 100.5 or chills.   MEDICATIONS ON DISCHARGE:  1. OxyContin 10 mg 2 p.o. q.12 h. x7 days, then decrease to 1 p.o. q.12 h.     x7 days and then discontinue, #42 with no refills.  2. Percocet 325/5 mg 1 to 2 p.o. q.4-6 h. p.r.n. for breakthrough pain, #50     with no refills.  3. Cephalexin 500 mg 1 t.i.d. x5 days, #15 with no refills.  4. Coumadin 2.5 mg 2 p.o. daily until told to change dose as per home     health/home health PT.  They should draw an INR on Wednesday, the 23rd.     He was given #60 of the Coumadin plus 1 refill.   FOLLOWUP INSTRUCTIONS:  Home health nurse and home health PT, INR draw on  Wednesday and Dr. Deidre Ala in the office on Friday, the 5th of March  2005; he is instructed to call for an appointment.  CONDITION UPON DISCHARGE:  His condition upon discharge was stable.      Madilyn Fireman, P.A.-C.                       Deidre Ala, M.D.    AC/MEDQ  D:  03/07/2004  T:  03/08/2004  Job:  [id number]
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
## 2011-10-02 LAB — CBC
Hemoglobin: 13.2
RBC: 4.34
WBC: 9

## 2011-10-02 LAB — DIFFERENTIAL
Basophils Relative: 1
Lymphocytes Relative: 26
Monocytes Absolute: 0.5
Monocytes Relative: 6
Neutro Abs: 6
Neutrophils Relative %: 67

## 2011-10-02 LAB — HEPATIC FUNCTION PANEL
ALT: 24
AST: 22
Albumin: 3.4 — ABNORMAL LOW
Bilirubin, Direct: 0.1
Total Bilirubin: 0.9

## 2011-10-02 LAB — RAPID URINE DRUG SCREEN, HOSP PERFORMED
Barbiturates: NOT DETECTED
Opiates: POSITIVE — AB

## 2011-10-02 LAB — BASIC METABOLIC PANEL
CO2: 28
Calcium: 9
Creatinine, Ser: 0.93
GFR calc Af Amer: >60
GFR calc non Af Amer: >60
Sodium: 139

## 2011-10-02 LAB — URINALYSIS, ROUTINE W REFLEX MICROSCOPIC
Hgb urine dipstick: NEGATIVE
Nitrite: NEGATIVE
Specific Gravity, Urine: 1.004 — ABNORMAL LOW
Urobilinogen, UA: 0.2

## 2015-07-21 ENCOUNTER — Encounter (HOSPITAL_COMMUNITY): Payer: Self-pay | Admitting: Emergency Medicine

## 2015-07-21 ENCOUNTER — Emergency Department (HOSPITAL_COMMUNITY): Payer: Self-pay

## 2015-07-21 ENCOUNTER — Observation Stay (HOSPITAL_COMMUNITY)
Admission: EM | Admit: 2015-07-21 | Discharge: 2015-07-22 | Disposition: A | Discharge status: Home / Self Care | Payer: Self-pay | Attending: Family Medicine | Admitting: Family Medicine

## 2015-07-21 DIAGNOSIS — R4182 Altered mental status, unspecified: Secondary | ICD-10-CM | POA: Insufficient documentation

## 2015-07-21 DIAGNOSIS — R55 Syncope and collapse: Secondary | ICD-10-CM | POA: Insufficient documentation

## 2015-07-21 DIAGNOSIS — K219 Gastro-esophageal reflux disease without esophagitis: Secondary | ICD-10-CM | POA: Insufficient documentation

## 2015-07-21 DIAGNOSIS — J45909 Unspecified asthma, uncomplicated: Secondary | ICD-10-CM | POA: Insufficient documentation

## 2015-07-21 DIAGNOSIS — Z96651 Presence of right artificial knee joint: Secondary | ICD-10-CM | POA: Insufficient documentation

## 2015-07-21 DIAGNOSIS — Z886 Allergy status to analgesic agent status: Secondary | ICD-10-CM | POA: Insufficient documentation

## 2015-07-21 DIAGNOSIS — F3110 Bipolar disorder, current episode manic without psychotic features, unspecified: Secondary | ICD-10-CM

## 2015-07-21 DIAGNOSIS — G894 Chronic pain syndrome: Secondary | ICD-10-CM | POA: Insufficient documentation

## 2015-07-21 DIAGNOSIS — R40241 Glasgow coma scale score 13-15: Secondary | ICD-10-CM

## 2015-07-21 DIAGNOSIS — F1310 Sedative, hypnotic or anxiolytic abuse, uncomplicated: Secondary | ICD-10-CM | POA: Diagnosis present

## 2015-07-21 DIAGNOSIS — D509 Iron deficiency anemia, unspecified: Secondary | ICD-10-CM | POA: Insufficient documentation

## 2015-07-21 DIAGNOSIS — R400 Somnolence: Secondary | ICD-10-CM

## 2015-07-21 DIAGNOSIS — F329 Major depressive disorder, single episode, unspecified: Secondary | ICD-10-CM | POA: Insufficient documentation

## 2015-07-21 DIAGNOSIS — T50901A Poisoning by unspecified drugs, medicaments and biological substances, accidental (unintentional), initial encounter: Secondary | ICD-10-CM | POA: Diagnosis present

## 2015-07-21 DIAGNOSIS — F1410 Cocaine abuse, uncomplicated: Secondary | ICD-10-CM | POA: Diagnosis present

## 2015-07-21 DIAGNOSIS — F1414 Cocaine abuse with cocaine-induced mood disorder: Secondary | ICD-10-CM

## 2015-07-21 DIAGNOSIS — F1494 Cocaine use, unspecified with cocaine-induced mood disorder: Principal | ICD-10-CM | POA: Insufficient documentation

## 2015-07-21 DIAGNOSIS — F319 Bipolar disorder, unspecified: Secondary | ICD-10-CM | POA: Insufficient documentation

## 2015-07-21 DIAGNOSIS — M1990 Unspecified osteoarthritis, unspecified site: Secondary | ICD-10-CM | POA: Insufficient documentation

## 2015-07-21 DIAGNOSIS — R9431 Abnormal electrocardiogram [ECG] [EKG]: Secondary | ICD-10-CM | POA: Diagnosis present

## 2015-07-21 HISTORY — DX: Unspecified asthma, uncomplicated: J45.909

## 2015-07-21 LAB — CBC WITH DIFFERENTIAL/PLATELET
Basophils Absolute: 0 10*3/uL (ref 0.0–0.1)
Basophils Relative: 0 % (ref 0–1)
Eosinophils Absolute: 0.1 10*3/uL (ref 0.0–0.7)
Eosinophils Relative: 1 % (ref 0–5)
HCT: 28.9 % — ABNORMAL LOW (ref 39.0–52.0)
Hemoglobin: 8.6 g/dL — ABNORMAL LOW (ref 13.0–17.0)
Lymphocytes Relative: 25 % (ref 12–46)
Lymphs Abs: 2.3 10*3/uL (ref 0.7–4.0)
MCH: 20.5 pg — ABNORMAL LOW (ref 26.0–34.0)
MCHC: 29.8 g/dL — ABNORMAL LOW (ref 30.0–36.0)
MCV: 69 fL — ABNORMAL LOW (ref 78.0–100.0)
Monocytes Absolute: 0.7 10*3/uL (ref 0.1–1.0)
Monocytes Relative: 8 % (ref 3–12)
Neutro Abs: 5.9 10*3/uL (ref 1.7–7.7)
Neutrophils Relative %: 66 % (ref 43–77)
Platelets: 399 10*3/uL (ref 150–400)
RBC: 4.19 MIL/uL — ABNORMAL LOW (ref 4.22–5.81)
RDW: 19 % — ABNORMAL HIGH (ref 11.5–15.5)
WBC: 9 10*3/uL (ref 4.0–10.5)

## 2015-07-21 LAB — CREATININE, SERUM: Creatinine, Ser: 0.61 mg/dL (ref 0.61–1.24)

## 2015-07-21 LAB — COMPREHENSIVE METABOLIC PANEL: Anion gap: 3 — ABNORMAL LOW (ref 5–15)

## 2015-07-21 LAB — URINALYSIS, ROUTINE W REFLEX MICROSCOPIC
Bilirubin Urine: NEGATIVE
Glucose, UA: NEGATIVE mg/dL
Hgb urine dipstick: NEGATIVE
Ketones, ur: NEGATIVE mg/dL
Leukocytes, UA: NEGATIVE
Nitrite: NEGATIVE
Protein, ur: NEGATIVE mg/dL
Specific Gravity, Urine: 1.012 (ref 1.005–1.030)
Urobilinogen, UA: 1 mg/dL (ref 0.0–1.0)
pH: 6 (ref 5.0–8.0)

## 2015-07-21 LAB — I-STAT CHEM 8, ED
BUN: 8 mg/dL (ref 6–20)
CALCIUM ION: 1.23 mmol/L (ref 1.12–1.23)
CREATININE: 0.8 mg/dL (ref 0.61–1.24)
Chloride: 105 mmol/L (ref 101–111)
GLUCOSE: 95 mg/dL (ref 65–99)
HCT: 36 % — ABNORMAL LOW (ref 39.0–52.0)
Hemoglobin: 12.2 g/dL — ABNORMAL LOW (ref 13.0–17.0)
Potassium: 3.8 mmol/L (ref 3.5–5.1)
SODIUM: 143 mmol/L (ref 135–145)
TCO2: 25 mmol/L (ref 0–100)

## 2015-07-21 LAB — RAPID URINE DRUG SCREEN, HOSP PERFORMED
Amphetamines: NOT DETECTED
BARBITURATES: NOT DETECTED
BENZODIAZEPINES: POSITIVE — AB
COCAINE: POSITIVE — AB
OPIATES: NOT DETECTED
TETRAHYDROCANNABINOL: NOT DETECTED

## 2015-07-21 LAB — TYPE AND SCREEN
ABO/RH(D): B NEG
Antibody Screen: NEGATIVE

## 2015-07-21 LAB — BASIC METABOLIC PANEL
Anion gap: 5 (ref 5–15)
BUN: 8 mg/dL (ref 6–20)
CHLORIDE: 106 mmol/L (ref 101–111)
CO2: 27 mmol/L (ref 22–32)
CREATININE: 0.62 mg/dL (ref 0.61–1.24)
Calcium: 9 mg/dL (ref 8.9–10.3)
GFR calc non Af Amer: >60 mL/min (ref >60)
Glucose, Bld: 88 mg/dL (ref 65–99)
Potassium: 3.9 mmol/L (ref 3.5–5.1)
Sodium: 138 mmol/L (ref 135–145)

## 2015-07-21 LAB — CBC
HCT: 29.6 % — ABNORMAL LOW (ref 39.0–52.0)
Hemoglobin: 9 g/dL — ABNORMAL LOW (ref 13.0–17.0)
MCH: 21.3 pg — ABNORMAL LOW (ref 26.0–34.0)
MCHC: 30.4 g/dL (ref 30.0–36.0)
MCV: 70.1 fL — AB (ref 78.0–100.0)
Platelets: 424 10*3/uL — ABNORMAL HIGH (ref 150–400)
RBC: 4.22 MIL/uL (ref 4.22–5.81)
RDW: 19.3 % — ABNORMAL HIGH (ref 11.5–15.5)
WBC: 8.6 10*3/uL (ref 4.0–10.5)

## 2015-07-21 LAB — I-STAT TROPONIN, ED: Troponin i, poc: 0 ng/mL (ref 0.00–0.08)

## 2015-07-21 LAB — SAMPLE TO BLOOD BANK

## 2015-07-21 LAB — ETHANOL: Alcohol, Ethyl (B): <5 mg/dL (ref <5)

## 2015-07-21 LAB — ACETAMINOPHEN LEVEL: Acetaminophen (Tylenol), Serum: <10 ug/mL — ABNORMAL LOW (ref 10–30)

## 2015-07-21 LAB — LIPASE, BLOOD: Lipase: 28 U/L (ref 22–51)

## 2015-07-21 LAB — CBG MONITORING, ED: GLUCOSE-CAPILLARY: 94 mg/dL (ref 65–99)

## 2015-07-21 LAB — SALICYLATE LEVEL: Salicylate Lvl: <4 mg/dL (ref 2.8–30.0)

## 2015-07-21 LAB — MRSA PCR SCREENING: MRSA by PCR: NEGATIVE

## 2015-07-21 LAB — TROPONIN I
Troponin I: <0.03 ng/mL (ref <0.031)
Troponin I: 0.52 ng/mL (ref <0.031)

## 2015-07-21 LAB — MAGNESIUM: Magnesium: 2.3 mg/dL (ref 1.7–2.4)

## 2015-07-21 MED ORDER — POTASSIUM CHLORIDE 10 MEQ/100ML IV SOLN
10.0000 meq | Freq: Once | INTRAVENOUS | Status: AC
  Administered 2015-07-21: 10 meq via INTRAVENOUS

## 2015-07-21 MED ORDER — NALOXONE HCL 1 MG/ML IJ SOLN
1.0000 mg | Freq: Once | INTRAMUSCULAR | Status: AC
  Administered 2015-07-21: 1 mg via INTRAVENOUS

## 2015-07-21 MED ORDER — NALOXONE HCL 1 MG/ML IJ SOLN
INTRAMUSCULAR | Status: AC
  Filled 2015-07-21: qty 2

## 2015-07-21 MED ORDER — POTASSIUM CHLORIDE 10 MEQ/100ML IV SOLN
10.0000 meq | Freq: Once | INTRAVENOUS | Status: AC
  Administered 2015-07-21: 10 meq via INTRAVENOUS
  Filled 2015-07-21: qty 100

## 2015-07-21 MED ORDER — SODIUM CHLORIDE 0.9 % IV BOLUS (SEPSIS)
1000.0000 mL | Freq: Once | INTRAVENOUS | Status: AC
  Administered 2015-07-21: 1000 mL via INTRAVENOUS

## 2015-07-21 MED ORDER — POTASSIUM CHLORIDE 10 MEQ/100ML IV SOLN
INTRAVENOUS | Status: AC
  Filled 2015-07-21: qty 100

## 2015-07-21 MED ORDER — NALOXONE HCL 1 MG/ML IJ SOLN
1.0000 mg | INTRAMUSCULAR | Status: DC | PRN
  Filled 2015-07-21: qty 2

## 2015-07-21 MED ORDER — NALOXONE HCL 1 MG/ML IJ SOLN
1.0000 mg | Freq: Once | INTRAMUSCULAR | Status: AC
  Administered 2015-07-21: 1 mg via INTRAVENOUS
  Filled 2015-07-21: qty 2

## 2015-07-21 MED ORDER — HYDRALAZINE HCL 20 MG/ML IJ SOLN
10.0000 mg | Freq: Four times a day (QID) | INTRAMUSCULAR | Status: DC | PRN
  Administered 2015-07-21 – 2015-07-22 (×2): 10 mg via INTRAVENOUS
  Filled 2015-07-21 (×2): qty 1

## 2015-07-21 MED ORDER — SODIUM CHLORIDE 0.9 % IJ SOLN
3.0000 mL | Freq: Two times a day (BID) | INTRAMUSCULAR | Status: DC
Start: 2015-07-21 — End: 2015-07-22
  Administered 2015-07-21 – 2015-07-22 (×3): 3 mL via INTRAVENOUS

## 2015-07-21 MED ORDER — HEPARIN SODIUM (PORCINE) 5000 UNIT/ML IJ SOLN
5000.0000 [IU] | Freq: Three times a day (TID) | INTRAMUSCULAR | Status: DC
Start: 2015-07-21 — End: 2015-07-22
  Administered 2015-07-21 – 2015-07-22 (×4): 5000 [IU] via SUBCUTANEOUS
  Filled 2015-07-21 (×4): qty 1

## 2015-07-21 MED ORDER — DEXTROSE-NACL 5-0.45 % IV SOLN
INTRAVENOUS | Status: DC
Start: 2015-07-21 — End: 2015-07-22
  Administered 2015-07-21: 13:00:00 via INTRAVENOUS
  Administered 2015-07-22: 50 mL/h via INTRAVENOUS

## 2015-07-21 MED ORDER — IOHEXOL 300 MG/ML  SOLN
100.0000 mL | Freq: Once | INTRAMUSCULAR | Status: AC | PRN
  Administered 2015-07-21: 100 mL via INTRAVENOUS

## 2015-07-21 NOTE — ED Notes (Signed)
Pt placed on 2L of oxygen for sat of 82-85% while pt was sleeping.

## 2015-07-21 NOTE — ED Notes (Signed)
Re-Collect being done by phlebotomy.
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
## 2015-07-21 NOTE — ED Notes (Signed)
PA at bedside.

## 2015-07-21 NOTE — Progress Notes (Signed)
Clinical Social Work  CSW attempted to complete psychosocial assessment. Patient sleeping when CSW arrived and not compliant during assessment. CSW will follow up at later time to complete full assessment.  [place], [place] [phone]

## 2015-07-21 NOTE — ED Notes (Signed)
MD at bedside. Admitting.  Prior to admitting coming to the bedside patient unhooked himself from the IV. Standing at the door stating he has to urinate. This RN assisted pt with urinal after which assisted patient back to bed. Pt was oriented.

## 2015-07-21 NOTE — ED Notes (Signed)
Went in pt room to do a bladder scan. I attempted to arouse pt to get pt to lay on his back in order to preform bladder scan. Called pts name multiple times when pt didn't respond i tapped on pts arm. Pt then raised his arm in a swinging motion and said " God Dammint I hear you, I'll do it when im ready." RN and Charge RN made aware.

## 2015-07-21 NOTE — ED Notes (Signed)
MD at bedside. 

## 2015-07-21 NOTE — ED Notes (Addendum)
Narcan override/PRN was handed off to next shift, to [employer]

## 2015-07-21 NOTE — ED Notes (Signed)
PA gave verbal order to give second  of Narcan at bedside.  Order was placed later.
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
## 2015-07-21 NOTE — ED Provider Notes (Signed)
CSN: [id number]     Arrival date & time 07/21/15  0458 History   First MD Initiated Contact with Patient 07/21/15 0600     Chief Complaint  Patient presents with  . Loss of Consciousness     (Consider location/radiation/quality/duration/timing/severity/associated sxs/prior Treatment) Patient is a 44 y.o. male presenting with syncope. The history is provided by the police and the EMS personnel. No language interpreter was used.  Loss of Consciousness Ms. Milhouse is a 44 y.o male with a history of asthma who presents to the ED by EMS after being found unconscious by [employer]. He was easily arousable at that time. He stated that he was going to [facility] to be tested for bone cancer while with EMS. He has a history of substance abuse and is currently taking Suboxone. He has been unable to respond to my questions during my exam due to unresponsiveness.  Respiratory rate concerning. Nursing staff states he was awake and verbal on arrival.  Past Medical History  Diagnosis Date  . Asthma   . Blood transfusion without reported diagnosis    History reviewed. No pertinent past surgical history. No family history on file. History  Substance Use Topics  . Smoking status: Never Smoker   . Smokeless tobacco: Not on file  . Alcohol Use: Yes    Review of Systems  Unable to perform ROS: Acuity of condition  Cardiovascular: Positive for syncope.      Allergies  Ibuprofen and Ketorolac tromethamine  Home Medications   Prior to Admission medications   Not on File   BP 128/94 mmHg  Pulse 84  Temp(Src) 98.1 F (36.7 C) (Oral)  Resp 14  Ht 5\' 10"  (1.778 m)  Wt 140 lb (63.504 kg)  BMI 20.09 kg/m2  SpO2 98% Physical Exam  Constitutional: He appears well-developed and well-nourished. He appears listless.  HENT:  Head: Normocephalic.  Mouth/Throat: Uvula is midline, oropharynx is clear and moist and mucous membranes are normal.  Chewing tobacco on teeth and tongue.   Eyes: Conjunctivae are  normal.  Neck: Neck supple.  Cardiovascular: Normal rate, regular rhythm and normal heart sounds.   Pulmonary/Chest: Effort normal and breath sounds normal. No accessory muscle usage. Apnea noted. He has no decreased breath sounds. He has no wheezes. He has no rales.  RR of 8 at bedside.   Abdominal: Soft. There is no tenderness.  Musculoskeletal: Normal range of motion.  Head, neck, back, abdomen, chest, and legs appeared to be uninjured without rashes or ecchymosis.   Neurological: He appears listless. GCS eye subscore is 2. GCS verbal subscore is 2. GCS motor subscore is 4.  Patient was at no point verbal on my exam.  He only moaned with painful stimuli. GCS of 8.   Skin: Skin is warm and dry.  Nursing note and vitals reviewed.   ED Course  Procedures (including critical care time) Labs Review Labs Reviewed  COMPREHENSIVE METABOLIC PANEL - Abnormal; Notable for the following:    Anion gap 3 (*)    All other components within normal limits  URINE RAPID DRUG SCREEN, HOSP PERFORMED - Abnormal; Notable for the following:    Cocaine POSITIVE (*)    Benzodiazepines POSITIVE (*)    All other components within normal limits  ACETAMINOPHEN LEVEL - Abnormal; Notable for the following:    Acetaminophen (Tylenol), Serum <10 (*)    All other components within normal limits  CBC WITH DIFFERENTIAL/PLATELET - Abnormal; Notable for the following:    RBC 4.19 (*)  Hemoglobin 8.6 (*)    HCT 28.9 (*)    MCV 69.0 (*)    MCH 20.5 (*)    MCHC 29.8 (*)    RDW 19.0 (*)    All other components within normal limits  I-STAT CHEM 8, ED - Abnormal; Notable for the following:    Hemoglobin 12.2 (*)    HCT 36.0 (*)    All other components within normal limits  CBC WITH DIFFERENTIAL/PLATELET  ETHANOL  URINALYSIS, ROUTINE W REFLEX MICROSCOPIC (NOT AT [facility])  SALICYLATE LEVEL  LIPASE, BLOOD  MAGNESIUM  BASIC METABOLIC PANEL  ETHANOL  I-STAT TROPOININ, ED  CBG MONITORING, ED  SAMPLE TO BLOOD BANK   TYPE AND SCREEN    Imaging Review Ct Head Wo Contrast  07/21/2015   CLINICAL DATA:  Found on the side of the road  EXAM: CT HEAD WITHOUT CONTRAST  CT CERVICAL SPINE WITHOUT CONTRAST  TECHNIQUE: Multidetector CT imaging of the head and cervical spine was performed following the standard protocol without intravenous contrast. Multiplanar CT image reconstructions of the cervical spine were also generated.  COMPARISON:  01/29/2011  FINDINGS: CT HEAD FINDINGS  No skull fracture is noted. No intracranial hemorrhage, mass effect or midline shift. Mild atherosclerotic calcifications of carotid siphon. Mild cerebral atrophy. Arachnoid cyst in left temporal lobe is stable. No acute cortical infarction. No mass lesion is noted on this unenhanced scan.  CT CERVICAL SPINE FINDINGS  Axial images of the cervical spine shows no acute fracture or subluxation. Computer processed images shows no acute fracture or subluxation. Motion artifacts are noted. There is no pneumothorax in visualized lung apices. Mild emphysematous changes bilaterally.  No prevertebral soft tissue swelling. Cervical airway is patent. Degenerative changes C1-C2 articulation. Question laryngeal diverticulum containing air please see axial image 77 and 76.  IMPRESSION: 1. No acute intracranial abnormality. 2. Stable cerebral atrophy. 3. No cervical spine acute fracture or subluxation. 4. Emphysematous changes bilateral lung apices. 5. Question laryngeal diverticulum containing air.   Electronically Signed   By: Natasha Mead M.D.   On: 07/21/2015 08:22   Ct Cervical Spine Wo Contrast  07/21/2015   CLINICAL DATA:  Found on the side of the road  EXAM: CT HEAD WITHOUT CONTRAST  CT CERVICAL SPINE WITHOUT CONTRAST  TECHNIQUE: Multidetector CT imaging of the head and cervical spine was performed following the standard protocol without intravenous contrast. Multiplanar CT image reconstructions of the cervical spine were also generated.  COMPARISON:  01/29/2011   FINDINGS: CT HEAD FINDINGS  No skull fracture is noted. No intracranial hemorrhage, mass effect or midline shift. Mild atherosclerotic calcifications of carotid siphon. Mild cerebral atrophy. Arachnoid cyst in left temporal lobe is stable. No acute cortical infarction. No mass lesion is noted on this unenhanced scan.  CT CERVICAL SPINE FINDINGS  Axial images of the cervical spine shows no acute fracture or subluxation. Computer processed images shows no acute fracture or subluxation. Motion artifacts are noted. There is no pneumothorax in visualized lung apices. Mild emphysematous changes bilaterally.  No prevertebral soft tissue swelling. Cervical airway is patent. Degenerative changes C1-C2 articulation. Question laryngeal diverticulum containing air please see axial image 77 and 76.  IMPRESSION: 1. No acute intracranial abnormality. 2. Stable cerebral atrophy. 3. No cervical spine acute fracture or subluxation. 4. Emphysematous changes bilateral lung apices. 5. Question laryngeal diverticulum containing air.   Electronically Signed   By: Natasha Mead M.D.   On: 07/21/2015 08:22   Ct Abdomen Pelvis W Contrast  07/21/2015  CLINICAL DATA:  Found down on side of road. Abdominal tenderness. Lethargy.  EXAM: CT ABDOMEN AND PELVIS WITH CONTRAST  TECHNIQUE: Multidetector CT imaging of the abdomen and pelvis was performed using the standard protocol following bolus administration of intravenous contrast.  CONTRAST:  OMNIPAQUE IOHEXOL 300 MG/ML  SOLN  COMPARISON:  03/03/2006  FINDINGS: Mild dependent opacities in the right greater than left lower lobes likely represent atelectasis.  Diffusely decreased attenuation of the liver is compatible with mild steatosis. The gallbladder, spleen, adrenal glands, and pancreas are unremarkable. There is mild prominence of the renal collecting systems bilaterally, decreased on the left from the prior study. There is no residual left ureteral dilatation as was present on the  prior study. No renal or ureteral calculi are identified.  There is no evidence of bowel obstruction. Appendix is unremarkable. There is a moderate amount of colonic stool. There is prominent distention of the bladder. Punctate prostate calcification is unchanged. No free fluid or enlarged lymph nodes are identified. There is mild subcutaneous fat edema lateral to the proximal left femur. No acute osseous abnormality is identified.  IMPRESSION: 1. Minimal bilateral hydronephrosis, likely secondary to prominent bladder distention. No urinary tract calculi identified. 2. Hepatic steatosis.   Electronically Signed   By: Sebastian Ache   On: 07/21/2015 08:31   Dg Chest Portable 1 View  07/21/2015   CLINICAL DATA:  Unresponsive  EXAM: PORTABLE CHEST - 1 VIEW  COMPARISON:  08/17/2010  FINDINGS: Normal heart size. Vascular congestion without pulmonary edema. No pneumothorax. No consolidation. No pneumothorax.  IMPRESSION: Vascular congestion without pulmonary edema or consolidation.   Electronically Signed   By: Jolaine Click M.D.   On: 07/21/2015 07:33     EKG Interpretation   Date/Time:  Friday July 21 2015 05:26:18 EDT Ventricular Rate:  74 PR Interval:  138 QRS Duration: 94 QT Interval:  454 QTC Calculation: 504 R Axis:   -9 Text Interpretation:  Sinus rhythm Ventricular premature complex  Nonspecific T abnormalities, diffuse leads Prolonged QT interval Confirmed  by WARD,  DO, KRISTEN [phone]) on 07/21/2015 6:54:13 AM     CRITICAL CARE Performed by: Catha Gosselin   Total critical care time: 35  Critical care time was exclusive of separately billable procedures and treating other patients.  Critical care was necessary to treat or prevent imminent or life-threatening deterioration.  Critical care was time spent personally by me on the following activities: development of treatment plan with patient and/or surrogate as well as nursing, discussions with consultants, evaluation of patient's  response to treatment, examination of patient, obtaining history from patient or surrogate, ordering and performing treatments and interventions, ordering and review of laboratory studies, ordering and review of radiographic studies, pulse oximetry and re-evaluation of patient's condition.  MDM   Final diagnoses:  Somnolence  Patient with history of suboxone use and found unresponsive outside by [facility] and bought it by EMS.  He was reportedly verbal for [employer] as well as EMS.  Upon arrival, patient was initially unresponsive with sternal rub, had pinpoint pupils, and RR of 6-8. He also had critical labs:potassium, calcium, and hgb. Patient given 1mg  of narcan and was still not arousable. An additional 1mg  was ordered. He was able to respond to painful stimuli at that time.  I discussed this patient with Dr. Elesa Massed originally who was just leaving and then I updated Dr. Juleen China on what was going on as well as what had been done. He has seen and evaluated the patient.  Lab called stating that his labs were possibly an error. Labs were redrawn but potassium had been given IV.  Repeat labs showed normal potassium, Hgb 8.6 and positive drug screen for cocaine and benzodiazepines. Ethanol level was insignificant. UA, troponin, EKG were normal. CT head and neck was negative for any acute findings. CT abdomen shows minimal bilateral hydronephrosis most likely due to bladder distention but otherwise normal.  Chest xray negative for pulmonary edema or infiltrate.  Recheck: Patient still somnolent but arousable to painful stimuli. Respirations improved. GCS is 8.   I spoke to Dr. Mahala Menghini who will admit to stepdown for somnolence and polysubstance abuse.     Catha Gosselin, PA-C 07/21/15 1805  Raeford Razor, MD 07/23/15 [phone]

## 2015-07-21 NOTE — ED Provider Notes (Signed)
Medical screening examination/treatment/procedure(s) were conducted as a shared visit with non-physician practitioner(s) and myself.  I personally evaluated the patient during the encounter.   EKG Interpretation   Date/Time:  Friday July 21 2015 05:26:18 EDT Ventricular Rate:  74 PR Interval:  138 QRS Duration: 94 QT Interval:  454 QTC Calculation: 504 R Axis:   -9 Text Interpretation:  Sinus rhythm Ventricular premature complex  Nonspecific T abnormalities, diffuse leads Prolonged QT interval Confirmed  by WARD,  DO, KRISTEN ([id number]) on 07/21/2015 6:54:13 AM     44yM found by side of road. Report that unconscious, but also that easily aroused? Decreasing level of consciousness while in ED. Currently pt is very drowsy. Localizes pain. Mumbled speech. Mostly unintelligible, but was able to discern him say "It's so cold." Per brief review of records, has hx of opioid dependence and Fe def anemia.  Hemoglobin in 7s in January.  On 7/19 was 9.9. Black material on teeth/lips but suspect tobacco product. Has a can of dip on him.  [facility] note from June mentions: "Last year had egd and colonoscopy and repeat egd this month that showed esophagitis." He is afebrile, normotensive-to-hypertensive and has normal HR. No external signs of trauma. With unreliable history and being found by side of road will CT head/cervical spine. Seems diffusely tender on abdominal exam. Will CT for same reasons. Repeat labs to verify, but in reviewing prior records these may be correct.    Repeat labs better. Pt remains significantly encephalopathic though. Suspect drug related. Will admit for observation.   Raeford Razor, MD 07/23/15 [phone]

## 2015-07-21 NOTE — ED Notes (Signed)
Patient transported to CT 

## 2015-07-22 LAB — CBC
HCT: 33 % — ABNORMAL LOW (ref 39.0–52.0)
Hemoglobin: 9.7 g/dL — ABNORMAL LOW (ref 13.0–17.0)
MCH: 20.3 pg — AB (ref 26.0–34.0)
MCHC: 29.4 g/dL — AB (ref 30.0–36.0)
MCV: 69.2 fL — AB (ref 78.0–100.0)
PLATELETS: 437 10*3/uL — AB (ref 150–400)
RBC: 4.77 MIL/uL (ref 4.22–5.81)
RDW: 19 % — AB (ref 11.5–15.5)
WBC: 11.8 10*3/uL — ABNORMAL HIGH (ref 4.0–10.5)

## 2015-07-22 LAB — COMPREHENSIVE METABOLIC PANEL
ALT: 15 U/L — AB (ref 17–63)
AST: 18 U/L (ref 15–41)
Albumin: 3.9 g/dL (ref 3.5–5.0)
Alkaline Phosphatase: 48 U/L (ref 38–126)
Anion gap: 9 (ref 5–15)
BUN: 6 mg/dL (ref 6–20)
CALCIUM: 9.4 mg/dL (ref 8.9–10.3)
CHLORIDE: 103 mmol/L (ref 101–111)
CO2: 26 mmol/L (ref 22–32)
Creatinine, Ser: 0.61 mg/dL (ref 0.61–1.24)
GFR calc Af Amer: >60 mL/min (ref >60)
GFR calc non Af Amer: >60 mL/min (ref >60)
Glucose, Bld: 86 mg/dL (ref 65–99)
Potassium: 3.3 mmol/L — ABNORMAL LOW (ref 3.5–5.1)
SODIUM: 138 mmol/L (ref 135–145)
Total Bilirubin: 0.7 mg/dL (ref 0.3–1.2)
Total Protein: 7.7 g/dL (ref 6.5–8.1)

## 2015-07-22 LAB — PROTIME-INR
INR: 1.11 (ref 0.00–1.49)
PROTHROMBIN TIME: 14.5 s (ref 11.6–15.2)

## 2015-07-22 LAB — TROPONIN I

## 2015-07-22 MED ORDER — BUPRENORPHINE HCL 8 MG SL SUBL
8.0000 mg | SUBLINGUAL_TABLET | Freq: Every day | SUBLINGUAL | Status: DC
  Administered 2015-07-22: 8 mg via SUBLINGUAL
  Filled 2015-07-22: qty 1

## 2015-07-22 MED ORDER — BUPRENORPHINE HCL-NALOXONE HCL 8-2 MG SL SUBL: 2.0000 | SUBLINGUAL_TABLET | Freq: Every day | SUBLINGUAL | Status: DC

## 2015-07-22 MED ORDER — CETYLPYRIDINIUM CHLORIDE 0.05 % MT LIQD: 7.0000 mL | Freq: Two times a day (BID) | OROMUCOSAL | Status: DC

## 2015-07-22 MED ORDER — GABAPENTIN 300 MG PO CAPS
300.0000 mg | ORAL_CAPSULE | Freq: Three times a day (TID) | ORAL | Status: DC
  Administered 2015-07-22: 300 mg via ORAL
  Filled 2015-07-22: qty 1

## 2015-07-22 MED ORDER — BUSPIRONE HCL 5 MG PO TABS
5.0000 mg | ORAL_TABLET | Freq: Every morning | ORAL | Status: DC
  Administered 2015-07-22: 5 mg via ORAL
  Filled 2015-07-22: qty 1

## 2015-07-22 NOTE — Consult Note (Signed)
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 10"  (1.778 m), weight 63.504 kg (140 lb), SpO2 95 %.Body mass index is 20.09 kg/(m^2).  General Appearance: Casual  Eye Contact::  Good  Speech:  Clear and Coherent  Volume:  Normal  Mood:  Anxious and Depressed  Affect:  Appropriate and Congruent  Thought Process:  Coherent  Orientation:  Other:  Not applicable  Thought Content:  Rumination  Suicidal Thoughts:  No  Homicidal Thoughts:  No  Memory:  Immediate;   Fair Recent;   Fair  Judgement:  Impaired  Insight:  Fair  Psychomotor Activity:  Decreased and Restlessness  Concentration:  Fair  Recall:  Poor  Fund of Knowledge:Fair  Language: Fair  Akathisia:  Negative  Handed:  Right  AIMS (if  indicated):     Assets:  [employer]ed forces logistics/support/administrative officer Housing Leisure Time Resilience Social Support  ADL's:  Impaired  Cognition: WNL and Impaired,  Moderate  Sleep:      Medical Decision Making: New problem, with additional work up planned, Review of Psycho-Social Stressors (1), Review or order clinical lab tests (1), Established Problem, Worsening (2), Review of Last Therapy Session (1), Review or order medicine tests (1), Review of Medication Regimen & Side Effects (2) and Review of New Medication or Change in Dosage (2)  Treatment Plan Summary: Patient presented with intoxication with benzodiazepine's cocaine and unresponsiveness and reportedly overdose of drugs? Intentionally or unintentional Daily contact with patient to assess and evaluate symptoms and progress in treatment and Medication management  Plan:  Safety concerns: Discontinue safety sitter patient contract for safety Patient has no benzodiazepine withdrawal symptoms Patient does not meet criteria for psychiatric inpatient admission. Supportive therapy provided about ongoing stressors. Appreciate psychiatric consultation and follow up as clinically required Please contact [phone] or [phone] if needs further assistance  Disposition: Discharged to outpatient medication management and substance abuse counseling when medically stable   Larisa Lanius,JANARDHAHA R. 07/22/2015 2:37 PM
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
## 2015-07-22 NOTE — Plan of Care (Signed)
Problem: Phase I Progression Outcomes Goal: OOB as tolerated unless otherwise ordered Outcome: Not Progressing Lethargic
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
## 2015-07-22 NOTE — Progress Notes (Addendum)
Patient discharged to home

## 2015-12-17 ENCOUNTER — Emergency Department (HOSPITAL_COMMUNITY)
Admission: EM | Admit: 2015-12-17 | Discharge: 2015-12-17 | Disposition: A | Discharge status: Home / Self Care | Payer: Self-pay | Attending: Emergency Medicine | Admitting: Emergency Medicine

## 2015-12-17 ENCOUNTER — Encounter (HOSPITAL_COMMUNITY): Payer: Self-pay

## 2015-12-17 ENCOUNTER — Emergency Department (HOSPITAL_COMMUNITY): Payer: Self-pay

## 2015-12-17 DIAGNOSIS — W11XXXA Fall on and from ladder, initial encounter: Secondary | ICD-10-CM | POA: Insufficient documentation

## 2015-12-17 DIAGNOSIS — S2231XA Fracture of one rib, right side, initial encounter for closed fracture: Secondary | ICD-10-CM | POA: Insufficient documentation

## 2015-12-17 DIAGNOSIS — F419 Anxiety disorder, unspecified: Secondary | ICD-10-CM | POA: Insufficient documentation

## 2015-12-17 DIAGNOSIS — Z79899 Other long term (current) drug therapy: Secondary | ICD-10-CM | POA: Insufficient documentation

## 2015-12-17 DIAGNOSIS — Y9289 Other specified places as the place of occurrence of the external cause: Secondary | ICD-10-CM | POA: Insufficient documentation

## 2015-12-17 DIAGNOSIS — Y998 Other external cause status: Secondary | ICD-10-CM | POA: Insufficient documentation

## 2015-12-17 DIAGNOSIS — J45909 Unspecified asthma, uncomplicated: Secondary | ICD-10-CM | POA: Insufficient documentation

## 2015-12-17 DIAGNOSIS — Y9389 Activity, other specified: Secondary | ICD-10-CM | POA: Insufficient documentation

## 2015-12-17 DIAGNOSIS — W19XXXA Unspecified fall, initial encounter: Secondary | ICD-10-CM

## 2015-12-17 HISTORY — DX: Anxiety disorder, unspecified: F41.9

## 2015-12-17 MED ORDER — OXYCODONE-ACETAMINOPHEN 5-325 MG PO TABS
2.0000 | ORAL_TABLET | Freq: Once | ORAL | Status: AC
Start: 2015-12-17 — End: 2015-12-17
  Administered 2015-12-17: 2 via ORAL
  Filled 2015-12-17: qty 2

## 2015-12-17 MED ORDER — TRAMADOL HCL 50 MG PO TABS: 50.0000 mg | ORAL_TABLET | Freq: Four times a day (QID) | ORAL | Status: DC | PRN

## 2015-12-17 NOTE — Discharge Instructions (Signed)

## 2015-12-17 NOTE — ED Provider Notes (Signed)
CSN: [id number]     Arrival date & time 12/17/15  0223 History   First MD Initiated Contact with Patient 12/17/15 0236     Chief Complaint  Patient presents with  . Fall    rt rib pain     (Consider location/radiation/quality/duration/timing/severity/associated sxs/prior Treatment) Patient is a 44 y.o. male presenting with fall. The history is provided by the patient. No language interpreter was used.  Fall This is a new problem. The current episode started in the past 7 days. Associated symptoms include chest pain and coughing. Pertinent negatives include no abdominal pain, fever, headaches, nausea or neck pain. Associated symptoms comments: Patient reports severe, worsening, sharp right lateral chest wall pain since an approximate 6 foot fall off a ladder 2 days ago onto cement. He denies LOC at the time of the accident. Currently, no headache, nausea, neck pain, abdominal pain or extremity injury. He has severe pain with cough, but denies SOB or hemoptysis. No fever. .    Past Medical History  Diagnosis Date  . Asthma   . Blood transfusion without reported diagnosis   . Anxiety    History reviewed. No pertinent past surgical history. No family history on file. Social History  Substance Use Topics  . Smoking status: Never Smoker   . Smokeless tobacco: None  . Alcohol Use: Yes    Review of Systems  Constitutional: Negative for fever.  Respiratory: Positive for cough. Negative for shortness of breath.   Cardiovascular: Positive for chest pain.  Gastrointestinal: Negative.  Negative for nausea and abdominal pain.  Musculoskeletal: Negative.  Negative for back pain and neck pain.  Skin: Negative.  Negative for wound.  Neurological: Negative.  Negative for headaches.      Allergies  Ibuprofen and Ketorolac tromethamine  Home Medications   Prior to Admission medications   Medication Sig Start Date End Date Taking? Authorizing Provider  busPIRone (BUSPAR) 10 MG tablet  Take 10 mg by mouth 3 (three) times daily. 08/23/15 08/22/16 Yes Historical Provider, MD  gabapentin (NEURONTIN) 300 MG capsule Take 900 mg by mouth 4 (four) times daily. 11/11/15 11/10/16 Yes Historical Provider, MD  megestrol (MEGACE ORAL) 40 MG/ML suspension Take 10 mLs by mouth every other day. After breakfast 11/15/15  Yes Historical Provider, MD  mirtazapine (REMERON) 15 MG tablet Take 45 mg by mouth at bedtime. 11/09/15 11/08/16 Yes Historical Provider, MD  tiZANidine (ZANAFLEX) 4 MG tablet Take 4 mg by mouth 3 (three) times daily. 10/02/15 10/01/16 Yes Historical Provider, MD   BP 138/88 mmHg  Pulse 95  Temp(Src) 98.2 F (36.8 C) (Oral)  Resp 16  SpO2 96% Physical Exam  Constitutional: He is oriented to person, place, and time. He appears well-developed and well-nourished. No distress.  Uncomfortable appearing.  HENT:  Head: Normocephalic.  Neck: Normal range of motion. Neck supple.  Cardiovascular: Normal rate and regular rhythm.   Pulmonary/Chest: Effort normal and breath sounds normal. He has no wheezes. He has no rales.  Significant right lateral chest wall TTP. No bruising, swelling. Breath sounds full.   Abdominal: Soft. Bowel sounds are normal. There is no tenderness. There is no rebound and no guarding.  Abdomen nontender, specifically, no RUQ tenderness.   Musculoskeletal: Normal range of motion.  No midline or paracervical tenderness.   Neurological: He is alert and oriented to person, place, and time.  Skin: Skin is warm and dry. No rash noted.  Psychiatric: He has a normal mood and affect.    ED Course  Procedures (including critical care time) Labs Review Labs Reviewed - No data to display  Imaging Review No results found. I have personally reviewed and evaluated these images and lab results as part of my medical decision-making.   EKG Interpretation None     Dg Ribs Unilateral W/chest Right  12/17/2015  CLINICAL DATA:  Rib pain after fall from ladder.  Anterior and axillary right-sided pain. EXAM: RIGHT RIBS AND CHEST - 3+ VIEW COMPARISON:  Chest radiograph 07/21/2015 FINDINGS: Questionable nondisplaced fracture of anterior right eighth rib. No additional or displaced rib fractures are seen. The cardiomediastinal contours are normal. The lungs are clear. Pulmonary vasculature is normal. No consolidation, pleural effusion, or pneumothorax. IMPRESSION: Questionable nondisplaced anterior right eighth rib fracture. Electronically Signed   By: Rubye OaksMelanie  Ehinger M.D.   On: 12/17/2015 03:59     MDM   Final diagnoses:  None    1. Rib fracture 2. Fall  Patient with recent fall having significant pain in right chest. Finding of "questionable" fracture clinically correlates with location and history of pain. Will treat with appropriate pain management and encourage outpatient follow up as needed.    Elpidio AnisShari Nicolet Griffy, PA-C 12/17/15 0421  Layla MawKristen N Ward, DO 12/17/15 [id number]

## 2015-12-17 NOTE — ED Notes (Signed)
Bed: [id number] Expected date:  Expected time:  Means of arrival:  Comments: Rib pain, fall

## 2015-12-17 NOTE — ED Notes (Signed)
Per EMS pt fell 766ft off a ladder Friday night, c/o rt rib pain, worse today; no LOC

## 2015-12-23 ENCOUNTER — Encounter (HOSPITAL_BASED_OUTPATIENT_CLINIC_OR_DEPARTMENT_OTHER): Payer: Self-pay | Admitting: *Deleted

## 2015-12-23 ENCOUNTER — Emergency Department (HOSPITAL_BASED_OUTPATIENT_CLINIC_OR_DEPARTMENT_OTHER)
Admission: EM | Admit: 2015-12-23 | Discharge: 2015-12-23 | Disposition: A | Discharge status: Home / Self Care | Payer: BLUE CROSS/BLUE SHIELD | Attending: Emergency Medicine | Admitting: Emergency Medicine

## 2015-12-23 ENCOUNTER — Emergency Department (HOSPITAL_BASED_OUTPATIENT_CLINIC_OR_DEPARTMENT_OTHER): Payer: BLUE CROSS/BLUE SHIELD

## 2015-12-23 DIAGNOSIS — X58XXXA Exposure to other specified factors, initial encounter: Secondary | ICD-10-CM | POA: Insufficient documentation

## 2015-12-23 DIAGNOSIS — F419 Anxiety disorder, unspecified: Secondary | ICD-10-CM | POA: Insufficient documentation

## 2015-12-23 DIAGNOSIS — R0602 Shortness of breath: Secondary | ICD-10-CM | POA: Insufficient documentation

## 2015-12-23 DIAGNOSIS — J45909 Unspecified asthma, uncomplicated: Secondary | ICD-10-CM | POA: Insufficient documentation

## 2015-12-23 DIAGNOSIS — Z79899 Other long term (current) drug therapy: Secondary | ICD-10-CM | POA: Insufficient documentation

## 2015-12-23 DIAGNOSIS — M25562 Pain in left knee: Secondary | ICD-10-CM

## 2015-12-23 DIAGNOSIS — Y9389 Activity, other specified: Secondary | ICD-10-CM | POA: Insufficient documentation

## 2015-12-23 DIAGNOSIS — R0789 Other chest pain: Secondary | ICD-10-CM | POA: Insufficient documentation

## 2015-12-23 DIAGNOSIS — S82002A Unspecified fracture of left patella, initial encounter for closed fracture: Secondary | ICD-10-CM | POA: Insufficient documentation

## 2015-12-23 DIAGNOSIS — Y998 Other external cause status: Secondary | ICD-10-CM | POA: Insufficient documentation

## 2015-12-23 DIAGNOSIS — G8929 Other chronic pain: Secondary | ICD-10-CM | POA: Insufficient documentation

## 2015-12-23 DIAGNOSIS — Y9289 Other specified places as the place of occurrence of the external cause: Secondary | ICD-10-CM | POA: Insufficient documentation

## 2015-12-23 MED ORDER — NAPROXEN 500 MG PO TABS: 500.0000 mg | ORAL_TABLET | Freq: Two times a day (BID) | ORAL | Status: DC

## 2015-12-23 NOTE — ED Notes (Signed)
States fell off a short ladder, landed on ladder hitting rt side of chest, also has left knee pain

## 2015-12-23 NOTE — ED Notes (Signed)
Is tender to palpation on rt side of chest, no discoloration, bruising noted at site of pain

## 2015-12-23 NOTE — ED Notes (Signed)
Patient transported to X-ray 

## 2015-12-23 NOTE — ED Provider Notes (Addendum)
CSN: [id number]     Arrival date & time 12/23/15  0746 History   First MD Initiated Contact with Patient 12/23/15 0802     Chief Complaint  Patient presents with  . Fall     (Consider location/radiation/quality/duration/timing/severity/associated sxs/prior Treatment) Patient is a 44 y.o. male presenting with fall. The history is provided by the patient.  Fall Associated symptoms include chest pain and shortness of breath. Pertinent negatives include no abdominal pain and no headaches.   patient was evaluated was a long emergency department on December 18 following a fall from a ladder. Patient had chest x-ray with rib series done at that time which raised a questionable right anterior eighth rib fracture but not confirmatory. Patient with persistent pain in that area. Also stating that he's having trouble breathing. Oxygen saturation upon arrival was 99% on room air. Not tachycardic. Patient also with history of chronic knee pain following fracture of the left knee that was repaired open reduction internal fixation [place] many months ago. Patient with the increased pain in that area and had some popping sensations morning when he got up. Patient states that both pains or 8 out of 10.  Past Medical History  Diagnosis Date  . Asthma   . Blood transfusion without reported diagnosis   . Anxiety    History reviewed. No pertinent past surgical history. No family history on file. Social History  Substance Use Topics  . Smoking status: Never Smoker   . Smokeless tobacco: None  . Alcohol Use: Yes    Review of Systems  Constitutional: Negative for fever.  HENT: Negative for congestion.   Eyes: Negative for redness.  Respiratory: Positive for shortness of breath.   Cardiovascular: Positive for chest pain.  Gastrointestinal: Negative for abdominal pain.  Musculoskeletal: Positive for arthralgias.  Skin: Negative for rash.  Neurological: Negative for headaches.  Hematological: Does  not bruise/bleed easily.  Psychiatric/Behavioral: Negative for confusion.      Allergies  Ibuprofen and Ketorolac tromethamine  Home Medications   Prior to Admission medications   Medication Sig Start Date End Date Taking? Authorizing Provider  busPIRone (BUSPAR) 10 MG tablet Take 10 mg by mouth 3 (three) times daily. 08/23/15 08/22/16  Historical Provider, MD  gabapentin (NEURONTIN) 300 MG capsule Take 900 mg by mouth 4 (four) times daily. 11/11/15 11/10/16  Historical Provider, MD  megestrol (MEGACE ORAL) 40 MG/ML suspension Take 10 mLs by mouth every other day. After breakfast 11/15/15   Historical Provider, MD  mirtazapine (REMERON) 15 MG tablet Take 45 mg by mouth at bedtime. 11/09/15 11/08/16  Historical Provider, MD  tiZANidine (ZANAFLEX) 4 MG tablet Take 4 mg by mouth 3 (three) times daily. 10/02/15 10/01/16  Historical Provider, MD  traMADol (ULTRAM) 50 MG tablet Take 1 tablet (50 mg total) by mouth every 6 (six) hours as needed. 12/17/15   Shari Upstill, PA-C   BP 134/83 mmHg  Pulse 88  Temp(Src) 98.2 F (36.8 C) (Oral)  Resp 24  Ht 5\' 10"  (1.778 m)  Wt 74.844 kg  BMI 23.68 kg/m2  SpO2 99% Physical Exam  Constitutional: He is oriented to person, place, and time. He appears well-developed and well-nourished. No distress.  HENT:  Head: Normocephalic and atraumatic.  Mouth/Throat: Oropharynx is clear and moist.  Eyes: Conjunctivae and EOM are normal. Pupils are equal, round, and reactive to light.  Neck: Normal range of motion. Neck supple.  Cardiovascular: Normal rate, regular rhythm and normal heart sounds.   No murmur heard. Pulmonary/Chest:  Effort normal and breath sounds normal. No respiratory distress. He has no wheezes. He has no rales. He exhibits tenderness.  Tenderness to palpation right anterior rib area around the the sixth seventh, eighth or ninth rib.  Abdominal: Soft. Bowel sounds are normal. There is no tenderness.  Musculoskeletal: He exhibits no edema.   Neurological: He is alert and oriented to person, place, and time. No cranial nerve deficit. He exhibits normal muscle tone. Coordination normal.  Left knee with evidence of surgical scarring no erythema no significant effusion reasonable range of motion. Tenderness to palpation along the joint line. Neurovascularly intact distally.  Skin: Skin is warm. No rash noted. No erythema.  Nursing note and vitals reviewed.   ED Course  Procedures (including critical care time) Labs Review Labs Reviewed - No data to display  Imaging Review No results found. I have personally reviewed and evaluated these images and lab results as part of my medical decision-making.   EKG Interpretation None      MDM   Final diagnoses:  Chronic knee pain, left  Acute chest wall pain    Patient evaluated at [facility] on December 18 following fall off a ladder. Chest x-ray and rib series at that time revealed the questionable right eighth rib fracture. Patient has a long-standing injury and fracture to his left knee which underwent surgery and open reduction repair. This was done at [facility]. Patient complaining of the persistent left knee pain and persistent right anterior chest wall pain.  Chest x-ray done here to make sure no evidence of any pneumothorax. X-ray of left knee done mostly for follow-up. If both do not show any acute findings we'll treat with Naprosyn and arrange follow-up with sports medicine for the knee pain.  Chest x-ray shows no evidence of any pneumothorax. No evidence of pneumonia. X-rays of the left knee show open reduction internal fixation of distal femur fracture. However there does appear to be a patellar fracture nondisplaced. Will treat with knee immobilizer and crutches and have him follow-up with sports medicine. This probably will not require surgical repair.  Vanetta Mulders, MD 12/23/15 1610  Vanetta Mulders, MD 12/23/15 [id number]  Vanetta Mulders, MD 12/23/15  [phone]

## 2015-12-23 NOTE — Discharge Instructions (Signed)
Evidence of left knee Injury. Wear the knee immobilizer and use the crutches. Again plummeted to follow-up with sports medicine. Take the Naprosyn as directed.

## 2015-12-23 NOTE — ED Notes (Signed)
States fell four days ago, states he felt pain in rt side of chest, having sharp pains at rt , went to an ED and had an x-rays, states was told had rib fx, returns today for increased pain

## 2015-12-23 NOTE — ED Notes (Signed)
At bedside to discuss DC instructions with pt, pt very verbally abusive to this RN, attempted to explain medication prescribed by EDP, cussing loudly due to not getting narcotics, states "I will get pain medicine off the streets", advised pt not to do so, to use medicine as prescribed by EDP, importance of coughing and deep breathing, also to increase fluid intake. Discussed safety while using crutches and how to apply and removed long leg immobilizer, opportunity for questions provided. Pt left complaining loudly and cussing at staff. Charge RN aware

## 2016-02-20 ENCOUNTER — Encounter (HOSPITAL_COMMUNITY): Payer: Self-pay | Admitting: Emergency Medicine

## 2016-02-20 ENCOUNTER — Emergency Department (HOSPITAL_COMMUNITY)
Admission: EM | Admit: 2016-02-20 | Discharge: 2016-02-20 | Disposition: A | Discharge status: Home / Self Care | Payer: MEDICAID | Attending: Emergency Medicine | Admitting: Emergency Medicine

## 2016-02-20 ENCOUNTER — Emergency Department (HOSPITAL_COMMUNITY): Payer: MEDICAID

## 2016-02-20 DIAGNOSIS — G8929 Other chronic pain: Secondary | ICD-10-CM | POA: Insufficient documentation

## 2016-02-20 DIAGNOSIS — J45909 Unspecified asthma, uncomplicated: Secondary | ICD-10-CM | POA: Insufficient documentation

## 2016-02-20 DIAGNOSIS — Z791 Long term (current) use of non-steroidal anti-inflammatories (NSAID): Secondary | ICD-10-CM | POA: Insufficient documentation

## 2016-02-20 DIAGNOSIS — Y9289 Other specified places as the place of occurrence of the external cause: Secondary | ICD-10-CM | POA: Insufficient documentation

## 2016-02-20 DIAGNOSIS — F419 Anxiety disorder, unspecified: Secondary | ICD-10-CM | POA: Insufficient documentation

## 2016-02-20 DIAGNOSIS — W230XXA Caught, crushed, jammed, or pinched between moving objects, initial encounter: Secondary | ICD-10-CM | POA: Insufficient documentation

## 2016-02-20 DIAGNOSIS — Z79899 Other long term (current) drug therapy: Secondary | ICD-10-CM | POA: Insufficient documentation

## 2016-02-20 DIAGNOSIS — S8002XA Contusion of left knee, initial encounter: Secondary | ICD-10-CM | POA: Insufficient documentation

## 2016-02-20 DIAGNOSIS — Y998 Other external cause status: Secondary | ICD-10-CM | POA: Insufficient documentation

## 2016-02-20 DIAGNOSIS — Y9389 Activity, other specified: Secondary | ICD-10-CM | POA: Insufficient documentation

## 2016-02-20 MED ORDER — TRAMADOL HCL 50 MG PO TABS
50.0000 mg | ORAL_TABLET | Freq: Once | ORAL | Status: AC
  Administered 2016-02-20: 50 mg via ORAL
  Filled 2016-02-20: qty 1

## 2016-02-20 MED ORDER — TRAMADOL HCL 50 MG PO TABS: 50.0000 mg | ORAL_TABLET | Freq: Four times a day (QID) | ORAL | Status: DC | PRN
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
## 2016-02-20 NOTE — Discharge Instructions (Signed)
Contusion °A contusion is a deep bruise. Contusions are the result of a blunt injury to tissues and muscle fibers under the skin. The injury causes bleeding under the skin. The skin overlying the contusion may turn blue, purple, or yellow. Minor injuries will give you a painless contusion, but more severe contusions may stay painful and swollen for a few weeks.  °CAUSES  °This condition is usually caused by a blow, trauma, or direct force to an area of the body. °SYMPTOMS  °Symptoms of this condition include: °· Swelling of the injured area. °· Pain and tenderness in the injured area. °· Discoloration. The area may have redness and then turn blue, purple, or yellow. °DIAGNOSIS  °This condition is diagnosed based on a physical exam and medical history. An X-ray, CT scan, or MRI may be needed to determine if there are any associated injuries, such as broken bones (fractures). °TREATMENT  °Specific treatment for this condition depends on what area of the body was injured. In general, the best treatment for a contusion is resting, icing, applying pressure to (compression), and elevating the injured area. This is often called the RICE strategy. Over-the-counter anti-inflammatory medicines may also be recommended for pain control.  °HOME CARE INSTRUCTIONS  °· Rest the injured area. °· If directed, apply ice to the injured area: °· Put ice in a plastic bag. °· Place a towel between your skin and the bag. °· Leave the ice on for 20 minutes, 2-3 times per day. °· If directed, apply light compression to the injured area using an elastic bandage. Make sure the bandage is not wrapped too tightly. Remove and reapply the bandage as directed by your health care provider. °· If possible, raise (elevate) the injured area above the level of your heart while you are sitting or lying down. °· Take over-the-counter and prescription medicines only as told by your health care provider. °SEEK MEDICAL CARE IF: °· Your symptoms do not  improve after several days of treatment. °· Your symptoms get worse. °· You have difficulty moving the injured area. °SEEK IMMEDIATE MEDICAL CARE IF:  °· You have severe pain. °· You have numbness in a hand or foot. °· Your hand or foot turns pale or cold. °  °This information is not intended to replace advice given to you by your health care provider. Make sure you discuss any questions you have with your health care provider. °  °Document Released: 09/25/2005 Document Revised: 09/06/2015 Document Reviewed: 05/03/2015 °Elsevier Interactive Patient Education ©2016 [employer] ° °Cryotherapy °Cryotherapy means treatment with cold. Ice or gel packs can be used to reduce both pain and swelling. Ice is the most helpful within the first 24 to 48 hours after an injury or flare-up from overusing a muscle or joint. Sprains, strains, spasms, burning pain, shooting pain, and aches can all be eased with ice. Ice can also be used when recovering from surgery. Ice is effective, has very few side effects, and is safe for most people to use. °PRECAUTIONS  °Ice is not a safe treatment option for people with: °· Raynaud phenomenon. This is a condition affecting small blood vessels in the extremities. Exposure to cold may cause your problems to return. °· Cold hypersensitivity. There are many forms of cold hypersensitivity, including: °¨ Cold urticaria. Red, itchy hives appear on the skin when the tissues begin to warm after being iced. °¨ Cold erythema. This is a red, itchy rash caused by exposure to cold. °¨ Cold hemoglobinuria. Red blood cells   break down when the tissues begin to warm after being iced. The hemoglobin that carry oxygen are passed into the urine because they cannot combine with blood proteins fast enough. °· Numbness or altered sensitivity in the area being iced. °If you have any of the following conditions, do not use ice until you have discussed cryotherapy with your caregiver: °· Heart conditions, such as  arrhythmia, angina, or chronic heart disease. °· High blood pressure. °· Healing wounds or open skin in the area being iced. °· Current infections. °· Rheumatoid arthritis. °· Poor circulation. °· Diabetes. °Ice slows the blood flow in the region it is applied. This is beneficial when trying to stop inflamed tissues from spreading irritating chemicals to surrounding tissues. However, if you expose your skin to cold temperatures for too long or without the proper protection, you can damage your skin or nerves. Watch for signs of skin damage due to cold. °HOME CARE INSTRUCTIONS °Follow these tips to use ice and cold packs safely. °· Place a dry or damp towel between the ice and skin. A damp towel will cool the skin more quickly, so you may need to shorten the time that the ice is used. °· For a more rapid response, add gentle compression to the ice. °· Ice for no more than 10 to 20 minutes at a time. The bonier the area you are icing, the less time it will take to get the benefits of ice. °· Check your skin after 5 minutes to make sure there are no signs of a poor response to cold or skin damage. °· Rest 20 minutes or more between uses. °· Once your skin is numb, you can end your treatment. You can test numbness by very lightly touching your skin. The touch should be so light that you do not see the skin dimple from the pressure of your fingertip. When using ice, most people will feel these normal sensations in this order: cold, burning, aching, and numbness. °· Do not use ice on someone who cannot communicate their responses to pain, such as small children or people with dementia. °HOW TO MAKE AN ICE PACK °Ice packs are the most common way to use ice therapy. Other methods include ice massage, ice baths, and cryosprays. Muscle creams that cause a cold, tingly feeling do not offer the same benefits that ice offers and should not be used as a substitute unless recommended by your caregiver. °To make an ice pack, do one  of the following: °· Place crushed ice or a bag of frozen vegetables in a sealable plastic bag. Squeeze out the excess air. Place this bag inside another plastic bag. Slide the bag into a pillowcase or place a damp towel between your skin and the bag. °· Mix 3 parts water with 1 part rubbing alcohol. Freeze the mixture in a sealable plastic bag. When you remove the mixture from the freezer, it will be slushy. Squeeze out the excess air. Place this bag inside another plastic bag. Slide the bag into a pillowcase or place a damp towel between your skin and the bag. °SEEK MEDICAL CARE IF: °· You develop white spots on your skin. This may give the skin a blotchy (mottled) appearance. °· Your skin turns blue or pale. °· Your skin becomes waxy or hard. °· Your swelling gets worse. °MAKE SURE YOU:  °· Understand these instructions. °· Will watch your condition. °· Will get help right away if you are not doing well or get worse. °  °  This information is not intended to replace advice given to you by your health care provider. Make sure you discuss any questions you have with your health care provider. °  °Document Released: 08/12/2011 Document Revised: 01/06/2015 Document Reviewed: 08/12/2011 °Elsevier Interactive Patient Education ©2016 Elsevier Inc[employer] ° °
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
## 2016-02-21 ENCOUNTER — Emergency Department (HOSPITAL_COMMUNITY): Payer: Self-pay

## 2016-02-21 ENCOUNTER — Encounter (HOSPITAL_COMMUNITY): Payer: Self-pay

## 2016-02-21 ENCOUNTER — Emergency Department (HOSPITAL_COMMUNITY)
Admission: EM | Admit: 2016-02-21 | Discharge: 2016-02-21 | Disposition: A | Discharge status: Home / Self Care | Payer: Self-pay | Attending: Emergency Medicine | Admitting: Emergency Medicine

## 2016-02-21 DIAGNOSIS — Y9389 Activity, other specified: Secondary | ICD-10-CM | POA: Insufficient documentation

## 2016-02-21 DIAGNOSIS — S52502A Unspecified fracture of the lower end of left radius, initial encounter for closed fracture: Secondary | ICD-10-CM | POA: Insufficient documentation

## 2016-02-21 DIAGNOSIS — W01198A Fall on same level from slipping, tripping and stumbling with subsequent striking against other object, initial encounter: Secondary | ICD-10-CM | POA: Insufficient documentation

## 2016-02-21 DIAGNOSIS — F419 Anxiety disorder, unspecified: Secondary | ICD-10-CM | POA: Insufficient documentation

## 2016-02-21 DIAGNOSIS — S01112A Laceration without foreign body of left eyelid and periocular area, initial encounter: Secondary | ICD-10-CM | POA: Insufficient documentation

## 2016-02-21 DIAGNOSIS — Y998 Other external cause status: Secondary | ICD-10-CM | POA: Insufficient documentation

## 2016-02-21 DIAGNOSIS — Z79899 Other long term (current) drug therapy: Secondary | ICD-10-CM | POA: Insufficient documentation

## 2016-02-21 DIAGNOSIS — Z791 Long term (current) use of non-steroidal anti-inflammatories (NSAID): Secondary | ICD-10-CM | POA: Insufficient documentation

## 2016-02-21 DIAGNOSIS — Y9289 Other specified places as the place of occurrence of the external cause: Secondary | ICD-10-CM | POA: Insufficient documentation

## 2016-02-21 DIAGNOSIS — J45909 Unspecified asthma, uncomplicated: Secondary | ICD-10-CM | POA: Insufficient documentation

## 2016-02-21 DIAGNOSIS — W19XXXA Unspecified fall, initial encounter: Secondary | ICD-10-CM

## 2016-02-21 MED ORDER — LIDOCAINE-EPINEPHRINE (PF) 2 %-1:200000 IJ SOLN
10.0000 mL | Freq: Once | INTRAMUSCULAR | Status: AC
  Administered 2016-02-21: 10 mL
  Filled 2016-02-21: qty 10

## 2016-02-21 MED ORDER — ACETAMINOPHEN 500 MG PO TABS
1000.0000 mg | ORAL_TABLET | Freq: Once | ORAL | Status: AC
  Administered 2016-02-21: 1000 mg via ORAL
  Filled 2016-02-21: qty 2

## 2016-02-21 MED ORDER — TRAMADOL HCL 50 MG PO TABS
100.0000 mg | ORAL_TABLET | Freq: Once | ORAL | Status: AC
  Administered 2016-02-21: 100 mg via ORAL
  Filled 2016-02-21: qty 2

## 2016-02-21 NOTE — ED Notes (Signed)
Calling ortho for splint sugar tong splint needed
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
## 2016-02-21 NOTE — ED Notes (Signed)
Pt was seen earlier and had a leg brace put on, he was discharged and walking , he bent over to take a break and had a syncopal episode and hit his forehead, he has a laceration to his forehead, and complains of nausea, he also complains of wrist pain. No LOC

## 2016-02-21 NOTE — ED Notes (Signed)
Lidocaine and cart waiting for provider  

## 2016-02-21 NOTE — Discharge Instructions (Signed)
Ice packs to the injured areas. The sutures need to be removed in about 5 days. Wear the splint on your wrist until you can be rechecked by your orthopedist. Call Dr Wilnette Kales office to let them know you have a "impacted fracture of the distal radius" and that you need an appointment to have him assess your fracture.  Return to the ED for any problems listed on the head injury sheet.  Facial Laceration A facial laceration is a cut on the face. These injuries can be painful and cause bleeding. Some cuts may need to be closed with stitches (sutures), skin adhesive strips, or wound glue. Cuts usually heal quickly but can leave a scar. It can take 1-2 years for the scar to go away completely. HOME CARE   Only take medicines as told by your doctor.  Follow your doctor's instructions for wound care. For Stitches:  Keep the cut clean and dry.  If you have a bandage (dressing), change it at least once a day. Change the bandage if it gets wet or dirty, or as told by your doctor.  Wash the cut with soap and water 2 times a day. Rinse the cut with water. Pat it dry with a clean towel.  Put a thin layer of medicated cream on the cut as told by your doctor.  You may shower after the first 24 hours. Do not soak the cut in water until the stitches are removed.  Have your stitches removed as told by your doctor.  Do not wear any makeup until a few days after your stitches are removed. For Skin Adhesive Strips:  Keep the cut clean and dry.  Do not get the strips wet. You may take a bath, but be careful to keep the cut dry.  If the cut gets wet, pat it dry with a clean towel.  The strips will fall off on their own. Do not remove the strips that are still stuck to the cut. For Wound Glue:  You may shower or take baths. Do not soak or scrub the cut. Do not swim. Avoid heavy sweating until the glue falls off on its own. After a shower or bath, pat the cut dry with a clean towel.  Do not put medicine or  makeup on your cut until the glue falls off.  If you have a bandage, do not put tape over the glue.  Avoid lots of sunlight or tanning lamps until the glue falls off.  The glue will fall off on its own in 5-10 days. Do not pick at the glue. After Healing:  Put sunscreen on the cut for the first year to reduce your scar. GET HELP IF:  You have a fever. GET HELP RIGHT AWAY IF:   Your cut area gets red, painful, or puffy (swollen).  You see a yellowish-white fluid (pus) coming from the cut.   This information is not intended to replace advice given to you by your health care provider. Make sure you discuss any questions you have with your health care provider.   Document Released: 06/03/2008 Document Revised: 01/06/2015 Document Reviewed: 07/29/2013 Elsevier Interactive Patient Education 2016 [employer]  Cast or Splint Care Casts and splints support injured limbs and keep bones from moving while they heal.  HOME CARE  Keep the cast or splint uncovered during the drying period.  A plaster cast can take 24 to 48 hours to dry.  A fiberglass cast will dry in less than 1 hour.  Do not rest the cast on anything harder than a pillow for 24 hours.  Do not put weight on your injured limb. Do not put pressure on the cast. Wait for your doctor's approval.  Keep the cast or splint dry.  Cover the cast or splint with a plastic bag during baths or wet weather.  If you have a cast over your chest and belly (trunk), take sponge baths until the cast is taken off.  If your cast gets wet, dry it with a towel or blow dryer. Use the cool setting on the blow dryer.  Keep your cast or splint clean. Wash a dirty cast with a damp cloth.  Do not put any objects under your cast or splint.  Do not scratch the skin under the cast with an object. If itching is a problem, use a blow dryer on a cool setting over the itchy area.  Do not trim or cut your cast.  Do not take out the padding from  inside your cast.  Exercise your joints near the cast as told by your doctor.  Raise (elevate) your injured limb on 1 or 2 pillows for the first 1 to 3 days. GET HELP IF:  Your cast or splint cracks.  Your cast or splint is too tight or too loose.  You itch badly under the cast.  Your cast gets wet or has a soft spot.  You have a bad smell coming from the cast.  You get an object stuck under the cast.  Your skin around the cast becomes red or sore.  You have new or more pain after the cast is put on. GET HELP RIGHT AWAY IF:  You have fluid leaking through the cast.  You cannot move your fingers or toes.  Your fingers or toes turn blue or white or are cool, painful, or puffy (swollen).  You have tingling or lose feeling (numbness) around the injured area.  You have bad pain or pressure under the cast.  You have trouble breathing or have shortness of breath.  You have chest pain.   This information is not intended to replace advice given to you by your health care provider. Make sure you discuss any questions you have with your health care provider.   Document Released: 04/17/2011 Document Revised: 08/18/2013 Document Reviewed: 06/24/2013 Elsevier Interactive Patient Education 2016 [employer]  Sutured Wound Care Sutures are stitches that can be used to close wounds. Taking care of your wound properly can help prevent pain and infection. It can also help your wound to heal more quickly. HOW TO CARE FOR YOUR SUTURED WOUND Wound Care  Keep the wound clean and dry.  If you were given a bandage (dressing), change it at least one time per day or as told by your doctor. You should also change it if it gets wet or dirty.  Keep the wound completely dry for the first 24 hours or as told by your doctor. After that time, you may shower or bathe. However, make sure that the wound is not soaked in water until the sutures have been removed.  Clean the wound one time each day  or as told by your doctor.  Wash the wound with soap and water.  Rinse the wound with water to remove all soap.  Pat the wound dry with a clean towel. Do not rub the wound.  After cleaning the wound, put a thin layer of antibiotic ointment on it as told your doctor. This  ointment:  Helps to prevent infection.  Keeps the bandage from sticking to the wound.  Have the sutures removed as told by your doctor. General Instructions  Take or apply medicines only as told by your doctor.  To help prevent scarring, make sure to cover your wound with sunscreen whenever you are outside after the sutures are removed and the wound is healed. Make sure to wear a sunscreen of at least 30 SPF.  If you were prescribed an antibiotic medicine or ointment, finish all of it even if you start to feel better.  Do not scratch or pick at the wound.  Keep all follow-up visits as told by your doctor. This is important.  Check your wound every day for signs of infection. Watch for:  Redness, swelling, or pain.  Fluid, blood, or pus.  Raise (elevate) the injured area above the level of your heart while you are sitting or lying down, if possible.  Avoid stretching your wound.  Drink enough fluids to keep your pee (urine) clear or pale yellow. GET HELP IF:  You were given a tetanus shot and you have any of these where the needle went in:  Swelling.  Very bad pain.  Redness.  Bleeding.  You have a fever.  A wound that was closed breaks open.  You notice a bad smell coming from the wound.  You notice something coming out of the wound, such as wood or glass.  Medicine does not help your pain.  You have any of these at the site of the wound.  More redness.  More swelling.  More pain.  You have any of these coming from the wound.  Fluid.  Blood.  Pus.  You notice a change in the color of your skin near the wound.  You need to change the bandage often due to fluid, blood, or  pus coming from the wound.  You have a new rash.  You have numbness around the wound. GET HELP RIGHT AWAY IF:  You have very bad swelling around the wound.  Your pain suddenly gets worse and is very bad.  You have painful lumps near the wound or on skin that is anywhere on your body.  You have a red streak going away from the wound.  The wound is on your hand or foot and you cannot move a finger or toe like normal.  The wound is on your hand or foot and you notice that your fingers or toes look pale or bluish.   This information is not intended to replace advice given to you by your health care provider. Make sure you discuss any questions you have with your health care provider.   Document Released: 06/03/2008 Document Revised: 05/02/2015 Document Reviewed: 07/28/2013 Elsevier Interactive Patient Education 2016 [employer]  Wrist Fracture A wrist fracture is a break or crack in one of the bones of your wrist. Your wrist is made up of eight small bones at the palm of your hand (carpal bones) and two long bones that make up your forearm (radius and ulna). The goal of treatment is to hold the injured bone in place while it heals. Surgery may or may not be needed to care for your injured wrist.  HOME CARE  Keep your injured wrist raised (elevated). Move your fingers as much as you can.  Do not put pressure on any part of your cast or splint. It may break.  Use a plastic bag to protect your cast or splint  from water while bathing or showering. Do not lower your cast or splint into water.  Take medicines only as told by your doctor.  Keep your cast or splint clean and dry. If it gets wet, damaged, or suddenly feels too tight, tell your doctor right away.  Do not use any tobacco products including cigarettes, chewing tobacco, or electronic cigarettes. Tobacco can slow bone healing. If you need help quitting, ask your doctor.  Keep all follow-up visits as told by your doctor. This  is important.  Ask your doctor if you should take supplements of calcium and vitamins C and D. GET HELP IF:   Your cast or splint is damaged, breaks, or gets wet.  You have a fever.  You have chills.  You have very bad pain that does not go away.  You have more swelling (inflammation) than before the cast was put on. GET HELP RIGHT AWAY IF:   Your hand or fingernails on the injured arm turn blue or gray, or feel cold or numb.  You lose some feeling in the fingers of your injured arm. MAKE SURE YOU:   Understand these instructions.  Will watch your condition.  Will get help right away if you are not doing well or get worse.   This information is not intended to replace advice given to you by your health care provider. Make sure you discuss any questions you have with your health care provider.   Document Released: 06/03/2008 Document Revised: 01/06/2015 Document Reviewed: 06/29/2012 Elsevier Interactive Patient Education [employer]

## 2016-02-21 NOTE — ED Provider Notes (Signed)
CSN: [id number]     Arrival date & time 02/21/16  0006 History  By signing my name below, I, Soijett Blue, attest that this documentation has been prepared under the direction and in the presence of Devoria Albe, MD at 0040. Electronically Signed: Soijett Blue, ED Scribe. 02/21/2016. 12:54 AM.   Chief Complaint  Patient presents with  . Head Laceration    The history is provided by the patient. No language interpreter was used.    Samuel Barrett is a 45 y.o. male who presents to the Emergency Department via EMS complaining of head laceration onset PTA. He notes that he has just been discharged from the ED after an evaluation of left knee injury and was ambulating up a hill with his left knee immobilizer on when he felt tired and bent over to sit down and lost his balance and fell and he struck his head on the curb. He almost passed out and he reports after the fall  having trouble remembering his phone number. Pt thinks that his last tetanus shot was within the past 10 years. He states that he is having associated symptoms of throbbing left wrist pain, nausea, HA, chills, and resolved visual disturbance (had blurred vision immediately after hitting his head). Pt is right hand dominant. He states that he has not tried any medications for the relief for his symptoms. He denies LOC, neck pain, and any other symptoms. Denies taking blood thinners at this time. Denies allergies to any medications. Dr. 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 Dr. Betti Cruz at [facility] is the pt PCP. Pt takes gabapentin,buspar and Remeron, daily. Pt denies smoking cigarettes, but he notes that he does use DIP. 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 Pt denies being on suboxone and he states that he completed it 2 months ago.   Per pt chart review: Pt was seen in the ED on 02/20/16 due to a left knee injury. Pt had a negative left knee  xray and was given a knee immobilizer and tramdol Rx.   PCP Dr Thornton Park at [facility] Ortho Dr Ivin Poot at [facility]  Past Medical History  Diagnosis Date  . Asthma   . Blood transfusion without reported diagnosis   . Anxiety    History reviewed. No pertinent past surgical history. History reviewed. No pertinent family history. Social History  Substance Use Topics  . Smoking status: Never Smoker   . Smokeless tobacco: None  . Alcohol Use: Yes  unemployed  Review of Systems  Constitutional: Positive for chills.  Eyes: Positive for visual disturbance (resolved).  Gastrointestinal: Positive for nausea.  Musculoskeletal: Positive for joint swelling and arthralgias. Negative for neck pain.  Skin: Positive for wound.  Neurological: Negative for syncope.  All other systems reviewed and are negative.     Allergies  Ibuprofen and Ketorolac tromethamine  Home Medications   Prior to Admission medications   Medication Sig Start Date End Date Taking? Authorizing Provider  busPIRone (BUSPAR) 10 MG tablet Take 10 mg by mouth 3 (three) times daily. 08/23/15 08/22/16  Historical Provider, MD  gabapentin (NEURONTIN) 300 MG capsule Take 900 mg by mouth 4 (four) times daily. 11/11/15 11/10/16  Historical Provider, MD  megestrol (MEGACE ORAL) 40 MG/ML suspension Take 10 mLs by mouth every other day. After breakfast 11/15/15   Historical Provider, MD  mirtazapine (REMERON) 15 MG tablet Take 45 mg  by mouth at bedtime. 11/09/15 11/08/16  Historical Provider, MD  naproxen (NAPROSYN) 500 MG tablet Take 1 tablet (500 mg total) by mouth 2 (two) times daily. 12/23/15   Vanetta Mulders, MD  tiZANidine (ZANAFLEX) 4 MG tablet Take 4 mg by mouth 3 (three) times daily. 10/02/15 10/01/16  Historical Provider, MD  traMADol (ULTRAM) 50 MG tablet Take 1 tablet (50 mg total) by mouth every 6 (six) hours as needed. 02/20/16   Shari Upstill, PA-C   BP 101/72 mmHg  Pulse 95  Temp(Src) 98.6 F (37 C) (Oral)  Resp 17  SpO2  99%  Vital signs normal   Physical Exam  Constitutional: He is oriented to person, place, and time. He appears well-developed and well-nourished.  Non-toxic appearance. He does not appear ill. No distress.  HENT:  Head: Normocephalic. Head is with laceration.  Right Ear: External ear normal.  Left Ear: External ear normal.  Nose: Nose normal. No mucosal edema or rhinorrhea.  Mouth/Throat: Oropharynx is clear and moist and mucous membranes are normal. No dental abscesses or uvula swelling.  2 cm laceration to lateral left eyebrow.  Eyes: Conjunctivae and EOM are normal. Pupils are equal, round, and reactive to light.  Neck: Normal range of motion and full passive range of motion without pain. Neck supple.  No neck tenderness.  Cardiovascular: Normal rate, regular rhythm and normal heart sounds.  Exam reveals no gallop and no friction rub.   No murmur heard. Pulmonary/Chest: Effort normal and breath sounds normal. No respiratory distress. He has no wheezes. He has no rhonchi. He has no rales. He exhibits no tenderness and no crepitus.  Abdominal: Soft. Normal appearance and bowel sounds are normal. He exhibits no distension. There is no tenderness. There is no rebound and no guarding.  Musculoskeletal: Normal range of motion. He exhibits no edema.       Left wrist: He exhibits tenderness and swelling.  Moves all extremities well. Swelling over the dorsum of left wrist/proximal hand on the radial aspect that almost feels cyst like with tenderness in the same area. Good distal pulses.   Neurological: He is alert and oriented to person, place, and time. He has normal strength. No cranial nerve deficit.  Skin: Skin is warm, dry and intact. No rash noted. No erythema. No pallor.  Psychiatric: He has a normal mood and affect. His speech is normal and behavior is normal. His mood appears not anxious.  Nursing note and vitals reviewed.        ED Course  Procedures (including critical care  time)  Medications  acetaminophen (TYLENOL) tablet 1,000 mg (1,000 mg Oral Given 02/21/16 0150)  lidocaine-EPINEPHrine (XYLOCAINE W/EPI) 2 %-1:200000 (PF) injection 10 mL (10 mLs Infiltration Given 02/21/16 0423)  traMADol (ULTRAM) tablet 100 mg (100 mg Oral Given 02/21/16 0420)     DIAGNOSTIC STUDIES: Oxygen Saturation is 98% on RA, nl by my interpretation.    COORDINATION OF CARE: 12:54 AM Discussed treatment plan with pt at bedside which includes CT head, left wrist xray, laceration repair and pt agreed to plan.  3:16 AM- Pt re-evaluated and informed of his CT and xray results. Pt is requesting further pain medications at this time.   Patient's laceration was repaired by PA Upstill.  Patient was placed in a sugar tong splint for his wrist fracture. Patient has orthopedist in [facility] he can follow-up with. He was given a prescription for tramadol on his ED visit for his knee injury just before he had this fall.  He can take the tramadol for pain. He was given a CD of his x-rays to take to his orthopedist at [facility].   Review of the [place] shows patient just got #56 Suboxone 8/2 filled on January 4. He had been getting it monthly since September.    Dg Wrist Complete Left  02/21/2016  CLINICAL DATA:  Fall today with pain and swelling about the left wrist. EXAM: LEFT WRIST - COMPLETE 3+ VIEW COMPARISON:  None. FINDINGS: Impacted minimally displaced distal radius fracture. No radiocarpal or distal radioulnar joint extension. There is an associated minimally displaced ulna styloid fracture. Suspect remote triquetrum fracture. Scaphoid is intact. IMPRESSION: Impacted minimally displaced distal radius fracture, no intra-articular extension. Associated ulna styloid fracture. Electronically Signed   By: Rubye Oaks M.D.   On: 02/21/2016 01:30   Ct Head Wo Contrast  02/21/2016  CLINICAL DATA:  Status post fall while trying to sit down. Hit left forehead on ground, with blurred  vision. Initial encounter. EXAM: CT HEAD WITHOUT CONTRAST TECHNIQUE: Contiguous axial images were obtained from the base of the skull through the vertex without intravenous contrast. COMPARISON:  CT of the head performed 07/21/2015 FINDINGS: There is no evidence of acute infarction, mass lesion, or intra- or extra-axial hemorrhage on CT. Prominence of the ventricles and sulci reflects mild cortical volume loss. Mild cerebellar atrophy is noted. Mild periventricular white matter change likely reflects small vessel ischemic microangiopathy. The brainstem and fourth ventricle are within normal limits. The basal ganglia are unremarkable in appearance. The cerebral hemispheres demonstrate grossly normal gray-white differentiation. No mass effect or midline shift is seen. There is no evidence of fracture; visualized osseous structures are unremarkable in appearance. The orbits are within normal limits. The paranasal sinuses and mastoid air cells are well-aerated. A soft tissue laceration is noted overlying the left orbit. IMPRESSION: 1. No evidence of traumatic intracranial injury or fracture. 2. Soft tissue laceration overlying the left orbit. 3. Mild cortical volume loss and scattered small vessel ischemic microangiopathy. Electronically Signed   By: Roanna Raider M.D.   On: 02/21/2016 01:33    I have personally reviewed and evaluated these images as part of my medical decision-making.   EKG Interpretation None      MDM   Final diagnoses:  Fall, initial encounter  Laceration of eyebrow, left, initial encounter  Distal radius fracture, left, closed, initial encounter   Plan discharge  Devoria Albe, MD, FACEP   I personally performed the services described in this documentation, which was scribed in my presence. The recorded information has been reviewed and considered.  Devoria Albe, MD, Concha Pyo, MD 02/21/16 [phone]

## 2016-02-21 NOTE — ED Provider Notes (Signed)
LACERATION REPAIR Performed by: Elpidio Anis A Authorized by: Elpidio Anis A Consent: Verbal consent obtained. Risks and benefits: risks, benefits and alternatives were discussed Consent given by: patient Patient identity confirmed: provided demographic data Prepped and Draped in normal sterile fashion Wound explored  Laceration Location: left eyebrow  Laceration Length: 3 cm, stellate wound  No Foreign Bodies seen or palpated  Anesthesia: local infiltration  Local anesthetic: lidocaine 2% w/epinephrine  Anesthetic total: 2 ml  Irrigation method: syringe Amount of cleaning: standard  Skin closure: 6-0 prolene Moderate complexity  Number of sutures: 6  Technique: simple interrupted  Patient tolerance: Patient tolerated the procedure well with no immediate complications.   Elpidio Anis, PA-C 02/21/16 0401  Devoria Albe, MD 02/21/16 [phone]

## 2016-02-21 NOTE — Progress Notes (Signed)
Orthopedic Tech Progress Note Patient Details:  MCKOY BHAKTA [birth year] [id number]  Ortho Devices Type of Ortho Device: Arm sling, Sugartong splint Ortho Device/Splint Location: lue Ortho Device/Splint Interventions: Ordered, Application   Trinna Post 02/21/2016, 6:20 AM

## 2016-07-19 ENCOUNTER — Encounter (HOSPITAL_COMMUNITY): Payer: Self-pay

## 2016-07-19 ENCOUNTER — Emergency Department (HOSPITAL_COMMUNITY)
Admission: EM | Admit: 2016-07-19 | Discharge: 2016-07-20 | Disposition: A | Discharge status: Home / Self Care | Payer: MEDICAID | Attending: Emergency Medicine | Admitting: Emergency Medicine

## 2016-07-19 DIAGNOSIS — Z7951 Long term (current) use of inhaled steroids: Secondary | ICD-10-CM | POA: Insufficient documentation

## 2016-07-19 DIAGNOSIS — Z79899 Other long term (current) drug therapy: Secondary | ICD-10-CM | POA: Insufficient documentation

## 2016-07-19 DIAGNOSIS — R55 Syncope and collapse: Secondary | ICD-10-CM

## 2016-07-19 DIAGNOSIS — J45909 Unspecified asthma, uncomplicated: Secondary | ICD-10-CM | POA: Insufficient documentation

## 2016-07-19 LAB — BASIC METABOLIC PANEL
ANION GAP: 4 — AB (ref 5–15)
BUN: 6 mg/dL (ref 6–20)
CALCIUM: 8.2 mg/dL — AB (ref 8.9–10.3)
CO2: 25 mmol/L (ref 22–32)
Chloride: 108 mmol/L (ref 101–111)
Creatinine, Ser: 0.79 mg/dL (ref 0.61–1.24)
GLUCOSE: 107 mg/dL — AB (ref 65–99)
POTASSIUM: 3.5 mmol/L (ref 3.5–5.1)
Sodium: 137 mmol/L (ref 135–145)

## 2016-07-19 LAB — CBC
HCT: 37.3 % — ABNORMAL LOW (ref 39.0–52.0)
Hemoglobin: 12.5 g/dL — ABNORMAL LOW (ref 13.0–17.0)
MCH: 29.6 pg (ref 26.0–34.0)
MCHC: 33.5 g/dL (ref 30.0–36.0)
MCV: 88.2 fL (ref 78.0–100.0)
Platelets: 306 10*3/uL (ref 150–400)
RBC: 4.23 MIL/uL (ref 4.22–5.81)
RDW: 13.6 % (ref 11.5–15.5)
WBC: 6 10*3/uL (ref 4.0–10.5)

## 2016-07-19 LAB — CBG MONITORING, ED: Glucose-Capillary: 110 mg/dL — ABNORMAL HIGH (ref 65–99)

## 2016-07-19 MED ORDER — SODIUM CHLORIDE 0.9 % IV BOLUS (SEPSIS)
1000.0000 mL | Freq: Once | INTRAVENOUS | Status: AC
  Administered 2016-07-19: 1000 mL via INTRAVENOUS

## 2016-07-19 NOTE — ED Notes (Signed)
Bed: WA03 Expected date:  Expected time:  Means of arrival:  Comments: 25M near syncope, hypotensive

## 2016-07-19 NOTE — ED Provider Notes (Signed)
CSN: [id number]     Arrival date & time 07/19/16  2151 History  By signing my name below, I, Emmanuella Mensah, attest that this documentation has been prepared under the direction and in the presence of Geoffery Lyons, MD. Electronically Signed: Angelene Giovanni, ED Scribe. 07/19/2016. 11:18 PM.    Chief Complaint  Patient presents with  . Loss of Consciousness  . Hypotension   Patient is a 45 y.o. male presenting with syncope. The history is provided by the patient. No language interpreter was used.  Loss of Consciousness Episode history:  Single Most recent episode:  Today Timing:  Rare Progression:  Resolved Chronicity:  New Relieved by:  None tried Worsened by:  Nothing tried Ineffective treatments:  None tried Associated symptoms: recent fall   Associated symptoms: no fever, no nausea and no vomiting   Risk factors: no seizures    HPI Comments: Samuel Barrett is a 45 y.o. male who presents to the Emergency Department for evaluation s/p an unwitnessed episode of syncope that occurred PTA. He reports that he is currently lightheaded and is experiencing mild bilateral knee pain s/p fall during the episode. He notes that he was told by EMS that his BP was low. Pt explains that he became lightheaded and wobbly as if he stood up too fast while he was sitting on the couch and when he went to try to take out the trash he had an episode of uncontrollable shaking and the next thing he remembers he was laying on the floor. He adds that he was outside all day helping his friend outside in the heat prior to the episode. He states that he has not been eating appropriately for the past 2 months, loosing approx 20 lbs in 1.5 weeks. No alleviating factors noted. Pt has not tried any medications PTA. He reports a hx of blood transfusion for past episodes of esophageal bleeding and pt has upcoming appointment in one week at [facility] for follow up with an endoscopy for further evaluation. He denies a hx of  seizures. He also denies any fever, n/v/d, bowel/bladder incontinence, melena, or blood in stool.    Past Medical History  Diagnosis Date  . Asthma   . Blood transfusion without reported diagnosis   . Anxiety    History reviewed. No pertinent past surgical history. No family history on file. Social History  Substance Use Topics  . Smoking status: Never Smoker   . Smokeless tobacco: None  . Alcohol Use: Yes    Review of Systems  Constitutional: Negative for fever.  Cardiovascular: Positive for syncope.  Gastrointestinal: Negative for nausea and vomiting.    A complete 10 system review of systems was obtained and all systems are negative except as noted in the HPI and PMH.    Allergies  Ibuprofen and Ketorolac tromethamine  Home Medications   Prior to Admission medications   Medication Sig Start Date End Date Taking? Authorizing Provider  busPIRone (BUSPAR) 10 MG tablet Take 10 mg by mouth 3 (three) times daily. 08/23/15 08/22/16 Yes Historical Provider, MD  fluticasone (FLONASE) 50 MCG/ACT nasal spray Place 2 sprays into both nostrils daily as needed for allergies.  03/23/15  Yes Historical Provider, MD  gabapentin (NEURONTIN) 300 MG capsule Take 900 mg by mouth 4 (four) times daily. 11/11/15 11/10/16 Yes Historical Provider, MD  mirtazapine (REMERON) 15 MG tablet Take 45 mg by mouth at bedtime. 11/09/15 11/08/16 Yes Historical Provider, MD  omeprazole (PRILOSEC) 20 MG capsule Take 20 mg by  mouth daily.    Yes Historical Provider, MD  tiZANidine (ZANAFLEX) 4 MG tablet Take 4 mg by mouth every 6 (six) hours as needed for muscle spasms.  10/02/15 10/01/16 Yes Historical Provider, MD  naproxen (NAPROSYN) 500 MG tablet Take 1 tablet (500 mg total) by mouth 2 (two) times daily. Patient not taking: Reported on 07/19/2016 12/23/15   Vanetta MuldersScott Zackowski, MD  traMADol (ULTRAM) 50 MG tablet Take 1 tablet (50 mg total) by mouth every 6 (six) hours as needed. Patient not taking: Reported on  07/19/2016 02/20/16   Elpidio AnisShari Upstill, PA-C   BP 111/84 mmHg  Pulse 56  Temp(Src) 98 F (36.7 C) (Oral)  Resp 19  SpO2 99% Physical Exam  Constitutional: He is oriented to person, place, and time. He appears well-developed and well-nourished.  HENT:  Head: Normocephalic and atraumatic.  Eyes: EOM are normal.  Neck: Normal range of motion.  Cardiovascular: Normal rate, regular rhythm, normal heart sounds and intact distal pulses.   Pulmonary/Chest: Effort normal and breath sounds normal. No respiratory distress.  Abdominal: Soft. He exhibits no distension. There is no tenderness.  Musculoskeletal: Normal range of motion.  Neurological: He is alert and oriented to person, place, and time.  Skin: Skin is warm and dry.  Psychiatric: He has a normal mood and affect. Judgment normal.  Nursing note and vitals reviewed.   ED Course  Procedures (including critical care time) DIAGNOSTIC STUDIES: Oxygen Saturation is 99% on RA, normal by my interpretation.    COORDINATION OF CARE: 11:17 PM- Pt advised of plan for treatment and pt agrees. Pt informed of his lab and EKG results. He will receive IV fluids and repeat vital signs in different positions.    Labs Review Labs Reviewed  BASIC METABOLIC PANEL - Abnormal; Notable for the following:    Glucose, Bld 107 (*)    Calcium 8.2 (*)    Anion gap 4 (*)    All other components within normal limits  CBC - Abnormal; Notable for the following:    Hemoglobin 12.5 (*)    HCT 37.3 (*)    All other components within normal limits  CBG MONITORING, ED - Abnormal; Notable for the following:    Glucose-Capillary 110 (*)    All other components within normal limits  URINALYSIS, ROUTINE W REFLEX MICROSCOPIC (NOT [facility])    Imaging Review No results found.   Geoffery Lyonsouglas Kareema Keitt, MD has personally reviewed and evaluated these images and lab results as part of his medical decision-making.   EKG Interpretation   Date/Time:  Friday July 19 2016 22:46:12  EDT Ventricular Rate:  56 PR Interval:    QRS Duration: 89 QT Interval:  442 QTC Calculation: 427 R Axis:   9 Text Interpretation:  Sinus rhythm Prolonged QT Confirmed by Elpidia Karn  MD,  Morrisa Aldaba ([id number]) on 07/19/2016 11:15:45 PM      MDM   Final diagnoses:  None    Patient with history of esophagitis with prior upper GI bleed. He presents for evaluation of dizziness. He reports being outside in the heat today helping a friend install a swimming pool. This evening when he got home he became dizzy when standing and nearly passed out. EMS was called and the patient was given saline in route. He was given an additional bag of normal saline and is now feeling better. When standing, his blood pressure does drop somewhat, however he is no longer symptomatic with this. I had entertained the possibility of a third liter of  fluid, however the patient is ready to go home. He will drink increased fluids for the next several days and will return to the ER if his symptoms significantly worsen or change.  I personally performed the services described in this documentation, which was scribed in my presence. The recorded information has been reviewed and is accurate.        Geoffery Lyons, MD 07/20/16 [phone]

## 2016-07-19 NOTE — ED Notes (Signed)
Per EMS- Was working outside today, said he didn't drink enough water. Dizziness, weakness, wobbly, shaky. Let himself down to the floor and lost consciousness. 1200 ml bolus given.

## 2016-07-20 NOTE — Discharge Instructions (Signed)
Increase your fluid intake by three or four 8 ounce glasses of water per day.  Return to the emergency department if you develop black or bloody stools, abdominal pain, or worsening of your symptoms.   Near-Syncope Near-syncope (commonly known as near fainting) is sudden weakness, dizziness, or feeling like you might pass out. During an episode of near-syncope, you may also develop pale skin, have tunnel vision, or feel sick to your stomach (nauseous). Near-syncope may occur when getting up after sitting or while standing for a long time. It is caused by a sudden decrease in blood flow to the brain. This decrease can result from various causes or triggers, most of which are not serious. However, because near-syncope can sometimes be a sign of something serious, a medical evaluation is required. The specific cause is often not determined. HOME CARE INSTRUCTIONS  Monitor your condition for any changes. The following actions may help to alleviate any discomfort you are experiencing:  Have someone stay with you until you feel stable.  Lie down right away and prop your feet up if you start feeling like you might faint. Breathe deeply and steadily. Wait until all the symptoms have passed. Most of these episodes last only a few minutes. You may feel tired for several hours.   Drink enough fluids to keep your urine clear or pale yellow.   If you are taking blood pressure or heart medicine, get up slowly when seated or lying down. Take several minutes to sit and then stand. This can reduce dizziness.  Follow up with your health care provider as directed. SEEK IMMEDIATE MEDICAL CARE IF:   You have a severe headache.   You have unusual pain in the chest, abdomen, or back.   You are bleeding from the mouth or rectum, or you have black or tarry stool.   You have an irregular or very fast heartbeat.   You have repeated fainting or have seizure-like jerking during an episode.   You faint when  sitting or lying down.   You have confusion.   You have difficulty walking.   You have severe weakness.   You have vision problems.  MAKE SURE YOU:   Understand these instructions.  Will watch your condition.  Will get help right away if you are not doing well or get worse.   This information is not intended to replace advice given to you by your health care provider. Make sure you discuss any questions you have with your health care provider.   Document Released: 12/16/2005 Document Revised: 12/21/2013 Document Reviewed: 05/21/2013 Elsevier Interactive Patient Education [employer]

## 2016-08-01 ENCOUNTER — Emergency Department (HOSPITAL_COMMUNITY): Payer: No Typology Code available for payment source

## 2016-08-01 ENCOUNTER — Emergency Department (HOSPITAL_COMMUNITY)
Admission: EM | Admit: 2016-08-01 | Discharge: 2016-08-01 | Disposition: A | Discharge status: Home / Self Care | Payer: No Typology Code available for payment source | Attending: Emergency Medicine | Admitting: Emergency Medicine

## 2016-08-01 ENCOUNTER — Encounter (HOSPITAL_COMMUNITY): Payer: Self-pay

## 2016-08-01 DIAGNOSIS — Z96651 Presence of right artificial knee joint: Secondary | ICD-10-CM | POA: Diagnosis not present

## 2016-08-01 DIAGNOSIS — Y92410 Unspecified street and highway as the place of occurrence of the external cause: Secondary | ICD-10-CM | POA: Diagnosis not present

## 2016-08-01 DIAGNOSIS — S42021A Displaced fracture of shaft of right clavicle, initial encounter for closed fracture: Secondary | ICD-10-CM | POA: Insufficient documentation

## 2016-08-01 DIAGNOSIS — Z859 Personal history of malignant neoplasm, unspecified: Secondary | ICD-10-CM | POA: Insufficient documentation

## 2016-08-01 DIAGNOSIS — J45909 Unspecified asthma, uncomplicated: Secondary | ICD-10-CM | POA: Diagnosis not present

## 2016-08-01 DIAGNOSIS — Y999 Unspecified external cause status: Secondary | ICD-10-CM | POA: Insufficient documentation

## 2016-08-01 DIAGNOSIS — S42001A Fracture of unspecified part of right clavicle, initial encounter for closed fracture: Secondary | ICD-10-CM

## 2016-08-01 DIAGNOSIS — Y939 Activity, unspecified: Secondary | ICD-10-CM | POA: Insufficient documentation

## 2016-08-01 DIAGNOSIS — S4991XA Unspecified injury of right shoulder and upper arm, initial encounter: Secondary | ICD-10-CM | POA: Diagnosis present

## 2016-08-01 DIAGNOSIS — Z79899 Other long term (current) drug therapy: Secondary | ICD-10-CM | POA: Diagnosis not present

## 2016-08-01 HISTORY — DX: Traumatic subdural hemorrhage with loss of consciousness of unspecified duration, initial encounter: S06.5X9A

## 2016-08-01 HISTORY — DX: Traumatic subdural hemorrhage with loss of consciousness status unknown, initial encounter: S06.5XAA

## 2016-08-01 MED ORDER — ACETAMINOPHEN 325 MG PO TABS: 650.0000 mg | ORAL_TABLET | Freq: Once | ORAL | Status: DC

## 2016-08-01 MED ORDER — OXYCODONE-ACETAMINOPHEN 5-325 MG PO TABS: 1.0000 | ORAL_TABLET | Freq: Three times a day (TID) | ORAL | 0 refills | Status: DC | PRN

## 2016-08-01 MED ORDER — FENTANYL CITRATE (PF) 100 MCG/2ML IJ SOLN: 50.0000 ug | Freq: Once | INTRAMUSCULAR | Status: DC

## 2016-08-01 MED ORDER — OXYCODONE-ACETAMINOPHEN 5-325 MG PO TABS
2.0000 | ORAL_TABLET | Freq: Once | ORAL | Status: AC
  Administered 2016-08-01: 2 via ORAL
  Filled 2016-08-01: qty 2

## 2016-08-01 MED ORDER — MORPHINE SULFATE 15 MG PO TABS: 30.0000 mg | ORAL_TABLET | ORAL | 0 refills | Status: DC | PRN

## 2016-08-01 MED ORDER — MORPHINE SULFATE 15 MG PO TABS: 15.0000 mg | ORAL_TABLET | ORAL | 0 refills | Status: DC | PRN

## 2016-08-01 NOTE — Progress Notes (Signed)
Orthopedic Tech Progress Note Patient Details:  Samuel Barrett 11-10-1971 [id number]  Ortho Devices Type of Ortho Device: Sling immobilizer Ortho Device/Splint Location: RUE Ortho Device/Splint Interventions: Ordered, Application   Jennye Moccasin 08/01/2016, 9:49 PM
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
## 2016-08-01 NOTE — ED Notes (Signed)
Placed patient into a gown and on the monitor 

## 2016-08-01 NOTE — Discharge Instructions (Signed)
Read the information below.   Your x-rays are remarkable for a fractured clavicle. You are being placed in a sling. You can apply ice for 20 minute increments for the next 48 hours. You can take 1000mg  q8hrs for pain. If pain is severe, you can take the morphine.   Use the prescribed medication as directed.   Please discuss all new medications with your pharmacist.   It is important that you follow up with orthopedics ASAP for further evaluation within the next week. I have provided the contact information above. You may return to the Emergency Department at any time for worsening condition or any new symptoms that concern you. Return to ED if you develop fever, redness/warmth, shortness of breath, chest pain, or numbness in your fingers.

## 2016-08-05 ENCOUNTER — Other Ambulatory Visit: Payer: Self-pay | Admitting: Orthopaedic Surgery

## 2016-08-06 ENCOUNTER — Encounter (HOSPITAL_COMMUNITY): Payer: Self-pay | Admitting: *Deleted

## 2016-08-06 ENCOUNTER — Inpatient Hospital Stay (HOSPITAL_COMMUNITY): Admission: RE | Admit: 2016-08-06 | Payer: Self-pay | Source: Ambulatory Visit

## 2016-08-06 MED ORDER — CEFAZOLIN SODIUM-DEXTROSE 2-4 GM/100ML-% IV SOLN
2.0000 g | INTRAVENOUS | Status: AC
  Administered 2016-08-07: 2 g via INTRAVENOUS
  Filled 2016-08-06: qty 100

## 2016-08-06 NOTE — Progress Notes (Signed)
Cardiologist denies  Medical Md is Dr.Reddy  Echo denies  Stress test denies  Heart cath denies  EKG in epic from 07-19-16   CXR in epic from 12-23-15

## 2016-08-07 ENCOUNTER — Encounter (HOSPITAL_COMMUNITY): Payer: Self-pay | Admitting: General Practice

## 2016-08-07 ENCOUNTER — Ambulatory Visit (HOSPITAL_COMMUNITY): Payer: Self-pay

## 2016-08-07 ENCOUNTER — Encounter (HOSPITAL_COMMUNITY)
Admission: RE | Disposition: A | Discharge status: Home / Self Care | Payer: Self-pay | Source: Ambulatory Visit | Attending: Orthopaedic Surgery

## 2016-08-07 ENCOUNTER — Ambulatory Visit (HOSPITAL_COMMUNITY): Payer: Self-pay | Admitting: Anesthesiology

## 2016-08-07 ENCOUNTER — Ambulatory Visit (HOSPITAL_COMMUNITY)
Admission: RE | Admit: 2016-08-07 | Discharge: 2016-08-07 | Disposition: A | Discharge status: Home / Self Care | Payer: Self-pay | Source: Ambulatory Visit | Attending: Orthopaedic Surgery | Admitting: Orthopaedic Surgery

## 2016-08-07 DIAGNOSIS — S42001A Fracture of unspecified part of right clavicle, initial encounter for closed fracture: Secondary | ICD-10-CM | POA: Insufficient documentation

## 2016-08-07 DIAGNOSIS — Z419 Encounter for procedure for purposes other than remedying health state, unspecified: Secondary | ICD-10-CM

## 2016-08-07 DIAGNOSIS — J45909 Unspecified asthma, uncomplicated: Secondary | ICD-10-CM | POA: Insufficient documentation

## 2016-08-07 DIAGNOSIS — Z9889 Other specified postprocedural states: Secondary | ICD-10-CM

## 2016-08-07 DIAGNOSIS — X58XXXA Exposure to other specified factors, initial encounter: Secondary | ICD-10-CM | POA: Insufficient documentation

## 2016-08-07 DIAGNOSIS — M1990 Unspecified osteoarthritis, unspecified site: Secondary | ICD-10-CM | POA: Insufficient documentation

## 2016-08-07 HISTORY — PX: ORIF CLAVICULAR FRACTURE: SHX5055

## 2016-08-07 HISTORY — DX: Pneumonia, unspecified organism: J18.9

## 2016-08-07 HISTORY — DX: Personal history of other medical treatment: Z92.89

## 2016-08-07 HISTORY — DX: Anemia, unspecified: D64.9

## 2016-08-07 HISTORY — DX: Personal history of urinary calculi: Z87.442

## 2016-08-07 HISTORY — DX: Unspecified osteoarthritis, unspecified site: M19.90

## 2016-08-07 HISTORY — DX: Other chronic pain: G89.29

## 2016-08-07 HISTORY — DX: Dorsalgia, unspecified: M54.9

## 2016-08-07 HISTORY — DX: Personal history of other diseases of the digestive system: Z87.19

## 2016-08-07 LAB — CBC
HEMATOCRIT: 44.6 % (ref 39.0–52.0)
Hemoglobin: 15.2 g/dL (ref 13.0–17.0)
MCH: 29.5 pg (ref 26.0–34.0)
MCHC: 34.1 g/dL (ref 30.0–36.0)
MCV: 86.6 fL (ref 78.0–100.0)
Platelets: 313 10*3/uL (ref 150–400)
RBC: 5.15 MIL/uL (ref 4.22–5.81)
RDW: 13.3 % (ref 11.5–15.5)
WBC: 8.7 10*3/uL (ref 4.0–10.5)

## 2016-08-07 SURGERY — OPEN REDUCTION INTERNAL FIXATION (ORIF) CLAVICULAR FRACTURE
Anesthesia: General | Site: Shoulder | Laterality: Right

## 2016-08-07 MED ORDER — 0.9 % SODIUM CHLORIDE (POUR BTL) OPTIME
TOPICAL | Status: DC | PRN
  Administered 2016-08-07: 1000 mL

## 2016-08-07 MED ORDER — ROCURONIUM BROMIDE 10 MG/ML (PF) SYRINGE
PREFILLED_SYRINGE | INTRAVENOUS | Status: AC
  Filled 2016-08-07: qty 20

## 2016-08-07 MED ORDER — EPHEDRINE SULFATE 50 MG/ML IJ SOLN
INTRAMUSCULAR | Status: DC | PRN
  Administered 2016-08-07 (×2): 10 mg via INTRAVENOUS

## 2016-08-07 MED ORDER — PROPOFOL 10 MG/ML IV BOLUS
INTRAVENOUS | Status: DC | PRN
  Administered 2016-08-07: 150 mg via INTRAVENOUS

## 2016-08-07 MED ORDER — LIDOCAINE 2% (20 MG/ML) 5 ML SYRINGE
INTRAMUSCULAR | Status: AC
  Filled 2016-08-07: qty 10

## 2016-08-07 MED ORDER — FENTANYL CITRATE (PF) 250 MCG/5ML IJ SOLN
INTRAMUSCULAR | Status: AC
  Filled 2016-08-07: qty 5

## 2016-08-07 MED ORDER — PHENYLEPHRINE 40 MCG/ML (10ML) SYRINGE FOR IV PUSH (FOR BLOOD PRESSURE SUPPORT)
PREFILLED_SYRINGE | INTRAVENOUS | Status: AC
  Filled 2016-08-07: qty 40

## 2016-08-07 MED ORDER — OXYCODONE HCL 5 MG PO TABS: 5.0000 mg | ORAL_TABLET | ORAL | 0 refills | Status: DC | PRN

## 2016-08-07 MED ORDER — EPHEDRINE 5 MG/ML INJ
INTRAVENOUS | Status: AC
  Filled 2016-08-07: qty 20

## 2016-08-07 MED ORDER — SUGAMMADEX SODIUM 200 MG/2ML IV SOLN
INTRAVENOUS | Status: AC
  Filled 2016-08-07: qty 2

## 2016-08-07 MED ORDER — FENTANYL CITRATE (PF) 100 MCG/2ML IJ SOLN
25.0000 ug | Freq: Once | INTRAMUSCULAR | Status: AC
  Administered 2016-08-07: 25 ug via INTRAVENOUS
  Filled 2016-08-07: qty 0.5

## 2016-08-07 MED ORDER — SUGAMMADEX SODIUM 200 MG/2ML IV SOLN
INTRAVENOUS | Status: DC | PRN
  Administered 2016-08-07: 150 mg via INTRAVENOUS

## 2016-08-07 MED ORDER — MIDAZOLAM HCL 2 MG/2ML IJ SOLN
INTRAMUSCULAR | Status: AC
  Filled 2016-08-07: qty 2

## 2016-08-07 MED ORDER — FENTANYL CITRATE (PF) 100 MCG/2ML IJ SOLN
INTRAMUSCULAR | Status: DC | PRN
  Administered 2016-08-07 (×2): 50 ug via INTRAVENOUS
  Administered 2016-08-07: 100 ug via INTRAVENOUS
  Administered 2016-08-07 (×3): 50 ug via INTRAVENOUS
  Administered 2016-08-07: 200 ug via INTRAVENOUS
  Administered 2016-08-07: 50 ug via INTRAVENOUS
  Administered 2016-08-07: 100 ug via INTRAVENOUS
  Administered 2016-08-07: 50 ug via INTRAVENOUS

## 2016-08-07 MED ORDER — HYDROMORPHONE HCL 1 MG/ML IJ SOLN
0.2500 mg | INTRAMUSCULAR | Status: DC | PRN
  Administered 2016-08-07 (×4): 0.5 mg via INTRAVENOUS

## 2016-08-07 MED ORDER — MIDAZOLAM HCL 5 MG/5ML IJ SOLN
INTRAMUSCULAR | Status: DC | PRN
  Administered 2016-08-07: 2 mg via INTRAVENOUS

## 2016-08-07 MED ORDER — ONDANSETRON HCL 4 MG/2ML IJ SOLN
INTRAMUSCULAR | Status: DC | PRN
  Administered 2016-08-07: 4 mg via INTRAVENOUS

## 2016-08-07 MED ORDER — HYDROMORPHONE HCL 1 MG/ML IJ SOLN
INTRAMUSCULAR | Status: AC
  Filled 2016-08-07: qty 1

## 2016-08-07 MED ORDER — ONDANSETRON HCL 4 MG/2ML IJ SOLN
INTRAMUSCULAR | Status: AC
  Filled 2016-08-07: qty 2

## 2016-08-07 MED ORDER — LIDOCAINE 2% (20 MG/ML) 5 ML SYRINGE
INTRAMUSCULAR | Status: AC
  Filled 2016-08-07: qty 5

## 2016-08-07 MED ORDER — ONDANSETRON HCL 4 MG PO TABS: 4.0000 mg | ORAL_TABLET | Freq: Three times a day (TID) | ORAL | 0 refills | Status: DC | PRN

## 2016-08-07 MED ORDER — LACTATED RINGERS IV SOLN
INTRAVENOUS | Status: DC
  Administered 2016-08-07 (×2): via INTRAVENOUS

## 2016-08-07 MED ORDER — METHOCARBAMOL 750 MG PO TABS: 750.0000 mg | ORAL_TABLET | Freq: Two times a day (BID) | ORAL | 0 refills | Status: DC | PRN

## 2016-08-07 MED ORDER — FENTANYL CITRATE (PF) 100 MCG/2ML IJ SOLN
INTRAMUSCULAR | Status: DC
Start: 2016-08-07 — End: 2016-08-07
  Filled 2016-08-07: qty 2

## 2016-08-07 MED ORDER — PROPOFOL 10 MG/ML IV BOLUS
INTRAVENOUS | Status: AC
  Filled 2016-08-07: qty 20

## 2016-08-07 MED ORDER — SENNOSIDES-DOCUSATE SODIUM 8.6-50 MG PO TABS: 1.0000 | ORAL_TABLET | Freq: Every evening | ORAL | 1 refills | Status: DC | PRN

## 2016-08-07 MED ORDER — SUCCINYLCHOLINE CHLORIDE 200 MG/10ML IV SOSY
PREFILLED_SYRINGE | INTRAVENOUS | Status: AC
  Filled 2016-08-07: qty 10

## 2016-08-07 MED ORDER — OXYCODONE HCL ER 10 MG PO T12A: 10.0000 mg | EXTENDED_RELEASE_TABLET | Freq: Two times a day (BID) | ORAL | 0 refills | Status: DC

## 2016-08-07 MED ORDER — ROCURONIUM BROMIDE 100 MG/10ML IV SOLN
INTRAVENOUS | Status: DC | PRN
  Administered 2016-08-07: 20 mg via INTRAVENOUS
  Administered 2016-08-07: 70 mg via INTRAVENOUS

## 2016-08-07 MED ORDER — LIDOCAINE HCL (CARDIAC) 20 MG/ML IV SOLN
INTRAVENOUS | Status: DC | PRN
  Administered 2016-08-07: 100 mg via INTRAVENOUS

## 2016-08-07 SURGICAL SUPPLY — 53 items
BIT DRILL 2.0 (BIT) ×2 IMPLANT
CLOSURE STERI-STRIP 1/2X4 (GAUZE/BANDAGES/DRESSINGS) ×1
CLOSURE WOUND 1/2 X4 (GAUZE/BANDAGES/DRESSINGS) ×1
CLSR STERI-STRIP ANTIMIC 1/2X4 (GAUZE/BANDAGES/DRESSINGS) ×1 IMPLANT
COVER SURGICAL LIGHT HANDLE (MISCELLANEOUS) ×3 IMPLANT
DRAPE C-ARM 42X72 X-RAY (DRAPES) ×3 IMPLANT
DRAPE IMP U-DRAPE 54X76 (DRAPES) ×3 IMPLANT
DRAPE INCISE IOBAN 66X45 STRL (DRAPES) IMPLANT
DRAPE ORTHO SPLIT 77X108 STRL (DRAPES) ×6
DRAPE SURG 17X23 STRL (DRAPES) ×3 IMPLANT
DRAPE SURG ORHT 6 SPLT 77X108 (DRAPES) IMPLANT
DRAPE U-SHAPE 47X51 STRL (DRAPES) ×3 IMPLANT
DRILL 2.6X220MM LONG AO (BIT) ×2 IMPLANT
DRSG MEPILEX BORDER 4X12 (GAUZE/BANDAGES/DRESSINGS) ×2 IMPLANT
DRSG TEGADERM 4X4.75 (GAUZE/BANDAGES/DRESSINGS) ×3 IMPLANT
DURAPREP 26ML APPLICATOR (WOUND CARE) ×3 IMPLANT
ELECT BLADE 4.0 EZ CLEAN MEGAD (MISCELLANEOUS) ×3
ELECT CAUTERY BLADE 6.4 (BLADE) ×3 IMPLANT
ELECT REM PT RETURN 9FT ADLT (ELECTROSURGICAL) ×3
ELECTRODE BLDE 4.0 EZ CLN MEGD (MISCELLANEOUS) ×1 IMPLANT
ELECTRODE REM PT RTRN 9FT ADLT (ELECTROSURGICAL) ×1 IMPLANT
FACESHIELD WRAPAROUND (MASK) IMPLANT
FACESHIELD WRAPAROUND OR TEAM (MASK) IMPLANT
GAUZE XEROFORM 1X8 LF (GAUZE/BANDAGES/DRESSINGS) ×3 IMPLANT
GLOVE SKINSENSE NS SZ7.5 (GLOVE) ×4
GLOVE SKINSENSE STRL SZ7.5 (GLOVE) ×2 IMPLANT
GOWN STRL REIN XL XLG (GOWN DISPOSABLE) ×6 IMPLANT
K-WIRE OLIVE STOP (WIRE) ×6
KIT BASIN OR (CUSTOM PROCEDURE TRAY) ×3 IMPLANT
KIT ROOM TURNOVER OR (KITS) ×3 IMPLANT
KWIRE OLIVE STOP (WIRE) IMPLANT
MANIFOLD NEPTUNE II (INSTRUMENTS) ×3 IMPLANT
NDL HYPO 25GX1X1/2 BEV (NEEDLE) IMPLANT
NEEDLE HYPO 25GX1X1/2 BEV (NEEDLE) IMPLANT
NS IRRIG 1000ML POUR BTL (IV SOLUTION) ×3 IMPLANT
PACK SHOULDER (CUSTOM PROCEDURE TRAY) ×3 IMPLANT
PAD ARMBOARD 7.5X6 YLW CONV (MISCELLANEOUS) ×6 IMPLANT
PLATE SUPERIOR MIDSHAFT 7H RT (Plate) ×2 IMPLANT
SCREW BONE 3.5X12 (Screw) ×2 IMPLANT
SCREW BONE 3.5X14MM (Screw) ×2 IMPLANT
SCREW BONE 3.5X16MM (Screw) ×8 IMPLANT
SCREW BONE 3.5X20MM (Screw) ×2 IMPLANT
SCREW LOCKING 16MM (Screw) ×2 IMPLANT
SPONGE LAP 18X18 X RAY DECT (DISPOSABLE) ×6 IMPLANT
STRIP CLOSURE SKIN 1/2X4 (GAUZE/BANDAGES/DRESSINGS) ×2 IMPLANT
SUCTION FRAZIER HANDLE 10FR (MISCELLANEOUS) ×2
SUCTION TUBE FRAZIER 10FR DISP (MISCELLANEOUS) ×1 IMPLANT
SUT MNCRL AB 4-0 PS2 18 (SUTURE) ×3 IMPLANT
SUT VIC AB 2-0 CT1 27 (SUTURE) ×3
SUT VIC AB 2-0 CT1 TAPERPNT 27 (SUTURE) ×1 IMPLANT
SUT VICRYL 0 CT 1 36IN (SUTURE) ×3 IMPLANT
SYR CONTROL 10ML LL (SYRINGE) IMPLANT
WATER STERILE IRR 1000ML POUR (IV SOLUTION) ×3 IMPLANT

## 2016-08-07 NOTE — Transfer of Care (Signed)
Immediate Anesthesia Transfer of Care Note  Patient: Samuel Barrett  Procedure(s) Performed: Procedure(s): OPEN REDUCTION INTERNAL FIXATION (ORIF) RIGHT CLAVICLE FRACTURE (Right)  Patient Location: PACU  Anesthesia Type:General  Level of Consciousness: awake, alert , oriented and patient cooperative  Airway & Oxygen Therapy: Patient Spontanous Breathing and Patient connected to nasal cannula oxygen  Post-op Assessment: Report given to RN and Post -op Vital signs reviewed and stable  Post vital signs: Reviewed and stable  Last Vitals:  Vitals:   08/07/16 1158  BP: (!) 151/107  Pulse: 75  Resp: 16  Temp: 36.5 C    Last Pain:  Vitals:   08/07/16 1520  TempSrc:   PainSc: 10-Worst pain ever      Patients Stated Pain Goal: 3 (08/07/16 1520)  Complications: No apparent anesthesia complications
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
## 2016-08-07 NOTE — Discharge Instructions (Signed)
Postoperative instructions:  Weightbearing: no weight bearing  Keep your dressing and/or splint clean and dry at all times.  You can remove your dressing on post-operative day #3 and change with a dry/sterile dressing or Band-Aids as needed thereafter.    Incision instructions:  Do not soak your incision for 3 weeks after surgery.  If the incision gets wet, pat dry and do not scrub the incision.  Pain control:  You have been given a prescription to be taken as directed for post-operative pain control.  In addition, elevate the operative extremity above the heart at all times to prevent swelling and throbbing pain.  Take over-the-counter Colace, 100mg  by mouth twice a day while taking narcotic pain medications to help prevent constipation.  Follow up appointments: 1) 10-14 days for suture removal and wound check. 2) Dr. Roda ShuttersXu as scheduled.   -------------------------------------------------------------------------------------------------------------  After Surgery Pain Control:  After your surgery, post-surgical discomfort or pain is likely. This discomfort can last several days to a few weeks. At certain times of the day your discomfort may be more intense.  Did you receive a nerve block?  A nerve block can provide pain relief for one hour to two days after your surgery. As long as the nerve block is working, you will experience little or no sensation in the area the surgeon operated on.  As the nerve block wears off, you will begin to experience pain or discomfort. It is very important that you begin taking your prescribed pain medication before the nerve block fully wears off. Treating your pain at the first sign of the block wearing off will ensure your pain is better controlled and more tolerable when full-sensation returns. Do not wait until the pain is intolerable, as the medicine will be less effective. It is better to treat pain in advance than to try and catch up.  General Anesthesia:    If you did not receive a nerve block during your surgery, you will need to start taking your pain medication shortly after your surgery and should continue to do so as prescribed by your surgeon.  Pain Medication:  Most commonly we prescribe Vicodin and Percocet for post-operative pain. Both of these medications contain a combination of acetaminophen (Tylenol) and a narcotic to help control pain.   It takes between 30 and 45 minutes before pain medication starts to work. It is important to take your medication before your pain level gets too intense.   Nausea is a common side effect of many pain medications. You will want to eat something before taking your pain medicine to help prevent nausea.   If you are taking a prescription pain medication that contains acetaminophen, we recommend that you do not take additional over the counter acetaminophen (Tylenol).  Other pain relieving options:   Using a cold pack to ice the affected area a few times a day (15 to 20 minutes at a time) can help to relieve pain, reduce swelling and bruising.   Elevation of the affected area can also help to reduce pain and swelling.

## 2016-08-07 NOTE — Anesthesia Procedure Notes (Signed)
Procedure Name: Intubation Date/Time: 08/07/2016 4:05 PM Performed by: Faustino CongressWHITE, Lindia Garms TENA Shaivi Rothschild Pre-anesthesia Checklist: Patient identified, Emergency Drugs available, Suction available and Patient being monitored Patient Re-evaluated:Patient Re-evaluated prior to inductionOxygen Delivery Method: Circle System Utilized Preoxygenation: Pre-oxygenation with 100% oxygen Intubation Type: IV induction Ventilation: Mask ventilation without difficulty Laryngoscope Size: Mac and 3 Grade View: Grade II Tube type: Oral Tube size: 7.5 mm Number of attempts: 1 Airway Equipment and Method: Stylet Placement Confirmation: ETT inserted through vocal cords under direct vision,  positive ETCO2 and breath sounds checked- equal and bilateral Secured at: 22 cm Tube secured with: Tape Dental Injury: Teeth and Oropharynx as per pre-operative assessment

## 2016-08-07 NOTE — Anesthesia Preprocedure Evaluation (Addendum)
Anesthesia Evaluation  Patient identified by MRN, date of birth, ID band Patient awake    Reviewed: Allergy & Precautions, H&P , NPO status , Patient's Chart, lab work & pertinent test results  Airway Mallampati: II  TM Distance: >3 FB Neck ROM: Full    Dental no notable dental hx. (+) Teeth Intact, Dental Advisory Given   Pulmonary asthma ,    Pulmonary exam normal breath sounds clear to auscultation       Cardiovascular negative cardio ROS   Rhythm:Regular Rate:Normal     Neuro/Psych negative neurological ROS  negative psych ROS   GI/Hepatic Neg liver ROS, Controlled,  Endo/Other  negative endocrine ROS  Renal/GU negative Renal ROS  negative genitourinary   Musculoskeletal  (+) Arthritis , Osteoarthritis,    Abdominal   Peds  Hematology negative hematology ROS (+)   Anesthesia Other Findings   Reproductive/Obstetrics negative OB ROS                            Anesthesia Physical Anesthesia Plan  ASA: II  Anesthesia Plan: General   Post-op Pain Management:    Induction: Intravenous  Airway Management Planned: Oral ETT  Additional Equipment:   Intra-op Plan:   Post-operative Plan: Extubation in OR  Informed Consent: I have reviewed the patients History and Physical, chart, labs and discussed the procedure including the risks, benefits and alternatives for the proposed anesthesia with the patient or authorized representative who has indicated his/her understanding and acceptance.   Dental advisory given  Plan Discussed with: CRNA  Anesthesia Plan Comments:         Anesthesia Quick Evaluation

## 2016-08-08 ENCOUNTER — Encounter (HOSPITAL_COMMUNITY): Payer: Self-pay | Admitting: Orthopaedic Surgery

## 2016-08-08 NOTE — H&P (Signed)
PREOPERATIVE H&P  Chief Complaint: right clavicle fracture  HPI: Samuel Barrett is a 45 y.o. male who presents for surgical treatment of right clavicle fracture.  He denies any changes in medical history.  Past Medical History:  Diagnosis Date  . Anemia    hx of-  . Arthritis   . Asthma   . Chronic back pain   . History of blood transfusion   . History of GI bleed   . History of kidney stones   . Joint pain   . Joint swelling   . Pneumonia    hx of-1 yr ago    Past Surgical History:  Procedure Laterality Date  . CARPAL TUNNEL RELEASE Right   . COLONOSCOPY WITH ESOPHAGOGASTRODUODENOSCOPY (EGD)    . JOINT REPLACEMENT Right    knee  . LEG SURGERY     left arthroscopy with screws    Social History   Social History  . Marital status: Single    Spouse name: N/A  . Number of children: N/A  . Years of education: N/A   Social History Main Topics  . Smoking status: Never Smoker  . Smokeless tobacco: Never Used  . Alcohol use No  . Drug use: No  . Sexual activity: Not Asked   Other Topics Concern  . None   Social History Narrative  . None   History reviewed. No pertinent family history. Allergies  Allergen Reactions  . Ibuprofen Hives    REACTION: hives  . Ketorolac Tromethamine Hives    REACTION: hives   Prior to Admission medications   Medication Sig Start Date End Date Taking? Authorizing Provider  gabapentin (NEURONTIN) 300 MG capsule Take 900 mg by mouth 4 (four) times daily. 11/11/15 11/10/16 Yes Historical Provider, MD  megestrol (MEGACE) 40 MG/ML suspension Take 400 mg by mouth daily. 07/31/16 07/31/17 Yes Historical Provider, MD  oxyCODONE-acetaminophen (PERCOCET/ROXICET) 5-325 MG tablet Take 1-2 tablets by mouth every 8 (eight) hours as needed for severe pain. 08/01/16  Yes Lona KettleAshley Laurel Meyer, PA-C  acetaminophen (TYLENOL) 500 MG tablet Take 1,000 mg by mouth every 6 (six) hours as needed for moderate pain.    Historical Provider, MD  methocarbamol  (ROBAXIN) 750 MG tablet Take 1 tablet (750 mg total) by mouth 2 (two) times daily as needed for muscle spasms. 08/07/16   Karimah Winquist Donnelly StagerM Dunia Pringle, MD  morphine (MSIR) 15 MG tablet Take 1 tablet (15 mg total) by mouth every 4 (four) hours as needed for severe pain. Patient not taking: Reported on 08/05/2016 08/01/16   [name], DO  ondansetron (ZOFRAN) 4 MG tablet Take 1-2 tablets (4-8 mg total) by mouth every 8 (eight) hours as needed for nausea or vomiting. 08/07/16   Tarry KosNaiping M Barclay Lennox, MD  oxyCODONE (OXY IR/ROXICODONE) 5 MG immediate release tablet Take 1-3 tablets (5-15 mg total) by mouth every 4 (four) hours as needed. 08/07/16   Tarry KosNaiping M Witney Huie, MD  oxyCODONE (OXYCONTIN) 10 mg 12 hr tablet Take 1 tablet (10 mg total) by mouth every 12 (twelve) hours. 08/07/16   Kerby Hockley Donnelly StagerM Kylina Vultaggio, MD  senna-docusate (SENOKOT S) 8.6-50 MG tablet Take 1 tablet by mouth at bedtime as needed. 08/07/16   Tarry KosNaiping M Gail Creekmore, MD     Positive ROS: All other systems have been reviewed and were otherwise negative with the exception of those mentioned in the HPI and as above.  Physical Exam: General: Alert, no acute distress Cardiovascular: No pedal edema Respiratory: No cyanosis, no use of accessory musculature  GI: abdomen soft Skin: No lesions in the area of chief complaint Neurologic: Sensation intact distally Psychiatric: Patient is competent for consent with normal mood and affect Lymphatic: no lymphedema  MUSCULOSKELETAL: exam stable  Assessment: right clavicle fracture  Plan: Plan for Procedure(s): OPEN REDUCTION INTERNAL FIXATION (ORIF) RIGHT CLAVICLE FRACTURE  The risks benefits and alternatives were discussed with the patient including but not limited to the risks of nonoperative treatment, versus surgical intervention including infection, bleeding, nerve injury,  blood clots, cardiopulmonary complications, morbidity, mortality, among others, and they were willing to proceed.   Cheral Almas, MD   08/08/2016 10:31 AM

## 2016-08-09 NOTE — Anesthesia Postprocedure Evaluation (Signed)
Anesthesia Post Note  Patient: Samuel Barrett  Procedure(s) Performed: Procedure(s) (LRB): OPEN REDUCTION INTERNAL FIXATION (ORIF) RIGHT CLAVICLE FRACTURE (Right)  Patient location during evaluation: PACU Anesthesia Type: General Level of consciousness: awake and alert Pain management: pain level controlled Vital Signs Assessment: post-procedure vital signs reviewed and stable Respiratory status: spontaneous breathing, nonlabored ventilation and respiratory function stable Cardiovascular status: blood pressure returned to baseline and stable Postop Assessment: no signs of nausea or vomiting Anesthetic complications: no    Last Vitals:  Vitals:   08/07/16 1833 08/07/16 1845  BP: (!) 156/101   Pulse: 68 81  Resp: 11 20  Temp: 36.4 C     Last Pain:  Vitals:   08/07/16 1833  TempSrc:   PainSc: 4                  Haru Anspaugh A

## 2016-08-14 ENCOUNTER — Emergency Department (HOSPITAL_COMMUNITY)
Admission: EM | Admit: 2016-08-14 | Discharge: 2016-08-14 | Disposition: A | Discharge status: Home / Self Care | Payer: No Typology Code available for payment source | Attending: Emergency Medicine | Admitting: Emergency Medicine

## 2016-08-14 ENCOUNTER — Emergency Department (HOSPITAL_COMMUNITY): Payer: No Typology Code available for payment source

## 2016-08-14 ENCOUNTER — Encounter (HOSPITAL_COMMUNITY): Payer: Self-pay | Admitting: Emergency Medicine

## 2016-08-14 DIAGNOSIS — Y92410 Unspecified street and highway as the place of occurrence of the external cause: Secondary | ICD-10-CM | POA: Insufficient documentation

## 2016-08-14 DIAGNOSIS — Z96651 Presence of right artificial knee joint: Secondary | ICD-10-CM | POA: Diagnosis not present

## 2016-08-14 DIAGNOSIS — S52021A Displaced fracture of olecranon process without intraarticular extension of right ulna, initial encounter for closed fracture: Secondary | ICD-10-CM | POA: Insufficient documentation

## 2016-08-14 DIAGNOSIS — Y939 Activity, unspecified: Secondary | ICD-10-CM | POA: Diagnosis not present

## 2016-08-14 DIAGNOSIS — S41001A Unspecified open wound of right shoulder, initial encounter: Secondary | ICD-10-CM | POA: Diagnosis not present

## 2016-08-14 DIAGNOSIS — Y999 Unspecified external cause status: Secondary | ICD-10-CM | POA: Diagnosis not present

## 2016-08-14 DIAGNOSIS — S4991XA Unspecified injury of right shoulder and upper arm, initial encounter: Secondary | ICD-10-CM | POA: Diagnosis present

## 2016-08-14 DIAGNOSIS — S42001A Fracture of unspecified part of right clavicle, initial encounter for closed fracture: Secondary | ICD-10-CM | POA: Insufficient documentation

## 2016-08-14 DIAGNOSIS — J45909 Unspecified asthma, uncomplicated: Secondary | ICD-10-CM | POA: Insufficient documentation

## 2016-08-14 LAB — CBC WITH DIFFERENTIAL/PLATELET
Basophils Absolute: 0 10*3/uL (ref 0.0–0.1)
Basophils Relative: 0 %
Eosinophils Absolute: 0 10*3/uL (ref 0.0–0.7)
Eosinophils Relative: 0 %
HCT: 44.1 % (ref 39.0–52.0)
Hemoglobin: 14.5 g/dL (ref 13.0–17.0)
Lymphocytes Relative: 13 %
Lymphs Abs: 3.3 10*3/uL (ref 0.7–4.0)
MCH: 29.3 pg (ref 26.0–34.0)
MCHC: 32.9 g/dL (ref 30.0–36.0)
MCV: 89.1 fL (ref 78.0–100.0)
Monocytes Absolute: 1.8 10*3/uL — ABNORMAL HIGH (ref 0.1–1.0)
Monocytes Relative: 7 %
Neutro Abs: 20.2 10*3/uL — ABNORMAL HIGH (ref 1.7–7.7)
Neutrophils Relative %: 80 %
Platelets: 458 10*3/uL — ABNORMAL HIGH (ref 150–400)
RBC: 4.95 MIL/uL (ref 4.22–5.81)
RDW: 13.5 % (ref 11.5–15.5)
WBC: 25.3 10*3/uL — ABNORMAL HIGH (ref 4.0–10.5)

## 2016-08-14 LAB — BASIC METABOLIC PANEL
Anion gap: 8 (ref 5–15)
BUN: 12 mg/dL (ref 6–20)
CO2: 22 mmol/L (ref 22–32)
Calcium: 9.3 mg/dL (ref 8.9–10.3)
Chloride: 108 mmol/L (ref 101–111)
Creatinine, Ser: 0.8 mg/dL (ref 0.61–1.24)
GFR calc Af Amer: >60 mL/min (ref >60)
GFR calc non Af Amer: >60 mL/min (ref >60)
Glucose, Bld: 98 mg/dL (ref 65–99)
Potassium: 3.5 mmol/L (ref 3.5–5.1)
Sodium: 138 mmol/L (ref 135–145)

## 2016-08-14 MED ORDER — HYDROMORPHONE HCL 1 MG/ML IJ SOLN
1.0000 mg | Freq: Once | INTRAMUSCULAR | Status: AC
Start: 2016-08-14 — End: 2016-08-14
  Administered 2016-08-14: 1 mg via INTRAVENOUS
  Filled 2016-08-14: qty 1

## 2016-08-14 MED ORDER — MORPHINE SULFATE (PF) 4 MG/ML IV SOLN
6.0000 mg | INTRAVENOUS | Status: DC | PRN
Start: 2016-08-14 — End: 2016-08-14
  Administered 2016-08-14: 6 mg via INTRAVENOUS
  Filled 2016-08-14: qty 2

## 2016-08-14 MED ORDER — OXYCODONE-ACETAMINOPHEN 5-325 MG PO TABS: 1.0000 | ORAL_TABLET | ORAL | 0 refills | Status: DC | PRN

## 2016-08-14 MED ORDER — CEPHALEXIN 500 MG PO CAPS: 500.0000 mg | ORAL_CAPSULE | Freq: Four times a day (QID) | ORAL | 0 refills | Status: DC

## 2016-08-14 MED ORDER — SODIUM CHLORIDE 0.9 % IV BOLUS (SEPSIS)
1000.0000 mL | Freq: Once | INTRAVENOUS | Status: AC
  Administered 2016-08-14: 1000 mL via INTRAVENOUS

## 2016-08-14 MED ORDER — HYDROMORPHONE HCL 1 MG/ML IJ SOLN
2.0000 mg | Freq: Once | INTRAMUSCULAR | Status: AC
  Administered 2016-08-14: 2 mg via INTRAVENOUS
  Filled 2016-08-14: qty 2

## 2016-08-14 MED ORDER — HYDROMORPHONE HCL 1 MG/ML IJ SOLN
1.0000 mg | Freq: Once | INTRAMUSCULAR | Status: AC
  Administered 2016-08-14: 1 mg via INTRAVENOUS
  Filled 2016-08-14: qty 1

## 2016-08-14 MED ORDER — DIAZEPAM 5 MG/ML IJ SOLN
5.0000 mg | Freq: Once | INTRAMUSCULAR | Status: AC
  Administered 2016-08-14: 5 mg via INTRAVENOUS
  Filled 2016-08-14: qty 2

## 2016-08-14 MED ORDER — CEFAZOLIN SODIUM-DEXTROSE 2-4 GM/100ML-% IV SOLN
2.0000 g | Freq: Once | INTRAVENOUS | Status: AC
  Administered 2016-08-14: 2 g via INTRAVENOUS
  Filled 2016-08-14: qty 100

## 2016-08-14 NOTE — ED Notes (Signed)
Pain med given 
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
## 2016-08-14 NOTE — ED Notes (Signed)
Pain med given  Dr Roda Shuttersxu here

## 2016-08-14 NOTE — Progress Notes (Signed)
Orthopedic Tech Progress Note Patient Details:  Samuel BuntingDanny C Barrett August 23, 1971 [id number]  Ortho Devices Type of Ortho Device: Ace wrap, Arm sling, Post (long arm) splint Ortho Device/Splint Location: RUE Ortho Device/Splint Interventions: Ordered, Application   Jennye MoccasinHughes, Samuel Barrett Samuel Barrett 08/14/2016, 5:10 PM
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
## 2016-08-14 NOTE — ED Notes (Signed)
The pt had his wounds cleaned by dr Roda Shuttersxu  Ortho tech at bedside placed bandgaes and splints in place.
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
## 2016-08-17 ENCOUNTER — Encounter (HOSPITAL_BASED_OUTPATIENT_CLINIC_OR_DEPARTMENT_OTHER): Payer: Self-pay | Admitting: Emergency Medicine

## 2016-08-17 ENCOUNTER — Emergency Department (HOSPITAL_BASED_OUTPATIENT_CLINIC_OR_DEPARTMENT_OTHER)
Admission: EM | Admit: 2016-08-17 | Discharge: 2016-08-18 | Disposition: A | Discharge status: Home / Self Care | Payer: No Typology Code available for payment source | Attending: Emergency Medicine | Admitting: Emergency Medicine

## 2016-08-17 DIAGNOSIS — Z96651 Presence of right artificial knee joint: Secondary | ICD-10-CM | POA: Insufficient documentation

## 2016-08-17 DIAGNOSIS — J45909 Unspecified asthma, uncomplicated: Secondary | ICD-10-CM | POA: Insufficient documentation

## 2016-08-17 DIAGNOSIS — X58XXXA Exposure to other specified factors, initial encounter: Secondary | ICD-10-CM | POA: Insufficient documentation

## 2016-08-17 DIAGNOSIS — Y999 Unspecified external cause status: Secondary | ICD-10-CM | POA: Insufficient documentation

## 2016-08-17 DIAGNOSIS — M79601 Pain in right arm: Secondary | ICD-10-CM | POA: Insufficient documentation

## 2016-08-17 DIAGNOSIS — Y939 Activity, unspecified: Secondary | ICD-10-CM | POA: Insufficient documentation

## 2016-08-17 DIAGNOSIS — Y929 Unspecified place or not applicable: Secondary | ICD-10-CM | POA: Insufficient documentation

## 2016-08-17 MED ORDER — HYDROMORPHONE HCL 1 MG/ML IJ SOLN
1.0000 mg | Freq: Once | INTRAMUSCULAR | Status: AC
  Administered 2016-08-17: 1 mg via INTRAMUSCULAR
  Filled 2016-08-17: qty 1

## 2016-08-17 NOTE — ED Triage Notes (Signed)
Patient reports that [facility] and [facility] on th 16th of aug. The patient had surgery. The patient reports that he is now having pain and swelling to his right arm and shoulder that is uncontrolled

## 2016-08-17 NOTE — Discharge Instructions (Signed)
Keep your follow up appointment with DR. Xu. Continue to take your pain medication as directed, rest the area, and apply ice. If your hand feels cold, numb, tingling or increases pain, loosen the ace wrap. Return here for any problems.
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
## 2016-08-17 NOTE — ED Notes (Signed)
Ice applied to area

## 2016-08-18 MED ORDER — OXYCODONE-ACETAMINOPHEN 7.5-325 MG PO TABS: 1.0000 | ORAL_TABLET | Freq: Four times a day (QID) | ORAL | 0 refills | Status: DC | PRN

## 2016-08-18 NOTE — ED Notes (Signed)
Pt given d/c instructions as per chart. Rx x 1 with narcotic prec instructions. Verbalizes understanding. No questions.

## 2016-08-20 ENCOUNTER — Encounter (HOSPITAL_COMMUNITY): Payer: Self-pay | Admitting: *Deleted

## 2016-08-21 ENCOUNTER — Other Ambulatory Visit: Payer: Self-pay | Admitting: Orthopaedic Surgery

## 2016-08-21 ENCOUNTER — Encounter (HOSPITAL_COMMUNITY)
Admission: RE | Disposition: A | Discharge status: Home / Self Care | Payer: Self-pay | Source: Ambulatory Visit | Attending: Orthopaedic Surgery

## 2016-08-21 ENCOUNTER — Observation Stay (HOSPITAL_COMMUNITY): Payer: No Typology Code available for payment source

## 2016-08-21 ENCOUNTER — Observation Stay (HOSPITAL_COMMUNITY)
Admission: RE | Admit: 2016-08-21 | Discharge: 2016-08-22 | Disposition: A | Discharge status: Home / Self Care | Payer: No Typology Code available for payment source | Source: Ambulatory Visit | Attending: Orthopaedic Surgery | Admitting: Orthopaedic Surgery

## 2016-08-21 ENCOUNTER — Inpatient Hospital Stay (HOSPITAL_COMMUNITY): Payer: No Typology Code available for payment source | Admitting: Anesthesiology

## 2016-08-21 ENCOUNTER — Encounter (HOSPITAL_COMMUNITY): Payer: Self-pay | Admitting: Urology

## 2016-08-21 DIAGNOSIS — J45909 Unspecified asthma, uncomplicated: Secondary | ICD-10-CM | POA: Insufficient documentation

## 2016-08-21 DIAGNOSIS — S52021A Displaced fracture of olecranon process without intraarticular extension of right ulna, initial encounter for closed fracture: Secondary | ICD-10-CM | POA: Diagnosis not present

## 2016-08-21 DIAGNOSIS — S42021A Displaced fracture of shaft of right clavicle, initial encounter for closed fracture: Principal | ICD-10-CM | POA: Insufficient documentation

## 2016-08-21 DIAGNOSIS — Z8781 Personal history of (healed) traumatic fracture: Secondary | ICD-10-CM

## 2016-08-21 DIAGNOSIS — M1990 Unspecified osteoarthritis, unspecified site: Secondary | ICD-10-CM | POA: Insufficient documentation

## 2016-08-21 DIAGNOSIS — G8929 Other chronic pain: Secondary | ICD-10-CM | POA: Insufficient documentation

## 2016-08-21 DIAGNOSIS — M549 Dorsalgia, unspecified: Secondary | ICD-10-CM | POA: Insufficient documentation

## 2016-08-21 DIAGNOSIS — K219 Gastro-esophageal reflux disease without esophagitis: Secondary | ICD-10-CM | POA: Diagnosis not present

## 2016-08-21 DIAGNOSIS — Z419 Encounter for procedure for purposes other than remedying health state, unspecified: Secondary | ICD-10-CM

## 2016-08-21 DIAGNOSIS — Z9889 Other specified postprocedural states: Secondary | ICD-10-CM

## 2016-08-21 HISTORY — PX: ORIF ELBOW FRACTURE: SHX5031

## 2016-08-21 HISTORY — DX: Psoriasis, unspecified: L40.9

## 2016-08-21 HISTORY — PX: ORIF CLAVICULAR FRACTURE: SHX5055

## 2016-08-21 HISTORY — DX: Gastro-esophageal reflux disease without esophagitis: K21.9

## 2016-08-21 HISTORY — DX: Pneumothorax, unspecified: J93.9

## 2016-08-21 SURGERY — OPEN REDUCTION INTERNAL FIXATION (ORIF) CLAVICULAR FRACTURE
Anesthesia: General | Laterality: Right

## 2016-08-21 MED ORDER — ALBUTEROL SULFATE HFA 108 (90 BASE) MCG/ACT IN AERS
INHALATION_SPRAY | RESPIRATORY_TRACT | Status: DC | PRN
  Administered 2016-08-21: 4 via RESPIRATORY_TRACT
  Administered 2016-08-21: 1 via RESPIRATORY_TRACT

## 2016-08-21 MED ORDER — METHOCARBAMOL 500 MG PO TABS
500.0000 mg | ORAL_TABLET | Freq: Four times a day (QID) | ORAL | Status: DC | PRN
  Administered 2016-08-21 – 2016-08-22 (×3): 500 mg via ORAL
  Filled 2016-08-21 (×4): qty 1

## 2016-08-21 MED ORDER — FENTANYL CITRATE (PF) 100 MCG/2ML IJ SOLN
INTRAMUSCULAR | Status: AC
Start: 2016-08-21 — End: 2016-08-21
  Filled 2016-08-21: qty 10

## 2016-08-21 MED ORDER — OXYCODONE HCL 5 MG PO TABS
ORAL_TABLET | ORAL | Status: AC
  Filled 2016-08-21: qty 2

## 2016-08-21 MED ORDER — METOCLOPRAMIDE HCL 5 MG PO TABS: 5.0000 mg | ORAL_TABLET | Freq: Three times a day (TID) | ORAL | Status: DC | PRN

## 2016-08-21 MED ORDER — OXYCODONE-ACETAMINOPHEN 10-325 MG PO TABS: 1.0000 | ORAL_TABLET | ORAL | 0 refills | Status: DC | PRN

## 2016-08-21 MED ORDER — OXYCODONE HCL ER 10 MG PO T12A: 10.0000 mg | EXTENDED_RELEASE_TABLET | Freq: Two times a day (BID) | ORAL | 0 refills | Status: DC

## 2016-08-21 MED ORDER — METHOCARBAMOL 500 MG PO TABS
ORAL_TABLET | ORAL | Status: AC
  Filled 2016-08-21: qty 1

## 2016-08-21 MED ORDER — SENNOSIDES-DOCUSATE SODIUM 8.6-50 MG PO TABS: 1.0000 | ORAL_TABLET | Freq: Every evening | ORAL | 1 refills | Status: DC | PRN

## 2016-08-21 MED ORDER — MORPHINE SULFATE 15 MG PO TABS
15.0000 mg | ORAL_TABLET | ORAL | Status: DC | PRN
  Administered 2016-08-21 – 2016-08-22 (×4): 15 mg via ORAL
  Filled 2016-08-21 (×4): qty 1

## 2016-08-21 MED ORDER — SODIUM CHLORIDE 0.9 % IV SOLN: INTRAVENOUS | Status: DC

## 2016-08-21 MED ORDER — ROCURONIUM BROMIDE 10 MG/ML (PF) SYRINGE
PREFILLED_SYRINGE | INTRAVENOUS | Status: AC
  Filled 2016-08-21: qty 10

## 2016-08-21 MED ORDER — METOCLOPRAMIDE HCL 5 MG/ML IJ SOLN: 5.0000 mg | Freq: Three times a day (TID) | INTRAMUSCULAR | Status: DC | PRN

## 2016-08-21 MED ORDER — ALBUTEROL SULFATE (2.5 MG/3ML) 0.083% IN NEBU: 2.5000 mg | INHALATION_SOLUTION | Freq: Four times a day (QID) | RESPIRATORY_TRACT | Status: DC | PRN

## 2016-08-21 MED ORDER — LABETALOL HCL 5 MG/ML IV SOLN
5.0000 mg | INTRAVENOUS | Status: AC | PRN
  Administered 2016-08-21 (×2): 5 mg via INTRAVENOUS

## 2016-08-21 MED ORDER — ONDANSETRON HCL 4 MG PO TABS
4.0000 mg | ORAL_TABLET | Freq: Three times a day (TID) | ORAL | 0 refills | Status: DC | PRN
Start: 2016-08-21 — End: 2017-01-31

## 2016-08-21 MED ORDER — MEPERIDINE HCL 25 MG/ML IJ SOLN: 6.2500 mg | INTRAMUSCULAR | Status: DC | PRN

## 2016-08-21 MED ORDER — PROPOFOL 10 MG/ML IV BOLUS
INTRAVENOUS | Status: AC
  Filled 2016-08-21: qty 20

## 2016-08-21 MED ORDER — DIPHENHYDRAMINE HCL 12.5 MG/5ML PO ELIX: 25.0000 mg | ORAL_SOLUTION | ORAL | Status: DC | PRN

## 2016-08-21 MED ORDER — ACETAMINOPHEN 650 MG RE SUPP: 650.0000 mg | Freq: Four times a day (QID) | RECTAL | Status: DC | PRN

## 2016-08-21 MED ORDER — LABETALOL HCL 5 MG/ML IV SOLN
INTRAVENOUS | Status: AC
  Filled 2016-08-21: qty 4

## 2016-08-21 MED ORDER — ARTIFICIAL TEARS OP OINT
TOPICAL_OINTMENT | OPHTHALMIC | Status: DC | PRN
  Administered 2016-08-21: 1 via OPHTHALMIC

## 2016-08-21 MED ORDER — FENTANYL CITRATE (PF) 100 MCG/2ML IJ SOLN
25.0000 ug | Freq: Once | INTRAMUSCULAR | Status: AC
  Administered 2016-08-21: 25 ug via INTRAVENOUS

## 2016-08-21 MED ORDER — FENTANYL CITRATE (PF) 100 MCG/2ML IJ SOLN
25.0000 ug | Freq: Once | INTRAMUSCULAR | Status: AC
  Administered 2016-08-21: 25 ug via INTRAVENOUS
  Filled 2016-08-21: qty 0.5

## 2016-08-21 MED ORDER — POLYETHYLENE GLYCOL 3350 17 G PO PACK: 17.0000 g | PACK | Freq: Every day | ORAL | Status: DC | PRN

## 2016-08-21 MED ORDER — FENTANYL CITRATE (PF) 100 MCG/2ML IJ SOLN
INTRAMUSCULAR | Status: AC
  Filled 2016-08-21: qty 2

## 2016-08-21 MED ORDER — FENTANYL CITRATE (PF) 100 MCG/2ML IJ SOLN
INTRAMUSCULAR | Status: AC
Start: 2016-08-21 — End: 2016-08-21
  Filled 2016-08-21: qty 2

## 2016-08-21 MED ORDER — METHOCARBAMOL 750 MG PO TABS: 750.0000 mg | ORAL_TABLET | Freq: Two times a day (BID) | ORAL | Status: DC | PRN

## 2016-08-21 MED ORDER — ONDANSETRON HCL 4 MG/2ML IJ SOLN
INTRAMUSCULAR | Status: AC
  Filled 2016-08-21: qty 6

## 2016-08-21 MED ORDER — FENTANYL CITRATE (PF) 100 MCG/2ML IJ SOLN
INTRAMUSCULAR | Status: AC
  Administered 2016-08-21: 25 ug via INTRAVENOUS
  Filled 2016-08-21: qty 2

## 2016-08-21 MED ORDER — MIDAZOLAM HCL 2 MG/2ML IJ SOLN
1.0000 mg | Freq: Once | INTRAMUSCULAR | Status: AC
  Administered 2016-08-21: 1 mg via INTRAVENOUS

## 2016-08-21 MED ORDER — LACTATED RINGERS IV SOLN
INTRAVENOUS | Status: DC
  Administered 2016-08-21 (×3): via INTRAVENOUS

## 2016-08-21 MED ORDER — HYDROMORPHONE HCL 1 MG/ML IJ SOLN
INTRAMUSCULAR | Status: AC
Start: 2016-08-21 — End: 2016-08-22
  Filled 2016-08-21: qty 1

## 2016-08-21 MED ORDER — METHOCARBAMOL 750 MG PO TABS: 750.0000 mg | ORAL_TABLET | Freq: Two times a day (BID) | ORAL | 0 refills | Status: DC | PRN

## 2016-08-21 MED ORDER — LIDOCAINE 2% (20 MG/ML) 5 ML SYRINGE
INTRAMUSCULAR | Status: AC
  Filled 2016-08-21: qty 20

## 2016-08-21 MED ORDER — OXYCODONE HCL 5 MG PO TABS
5.0000 mg | ORAL_TABLET | ORAL | Status: DC | PRN
  Administered 2016-08-21: 10 mg via ORAL
  Administered 2016-08-21 – 2016-08-22 (×5): 15 mg via ORAL
  Filled 2016-08-21 (×6): qty 3

## 2016-08-21 MED ORDER — LIDOCAINE HCL 4 % EX SOLN
CUTANEOUS | Status: DC | PRN
  Administered 2016-08-21: 2 mL via TOPICAL

## 2016-08-21 MED ORDER — SUGAMMADEX SODIUM 200 MG/2ML IV SOLN
INTRAVENOUS | Status: AC
  Filled 2016-08-21: qty 2

## 2016-08-21 MED ORDER — PANTOPRAZOLE SODIUM 40 MG PO TBEC
40.0000 mg | DELAYED_RELEASE_TABLET | Freq: Every day | ORAL | Status: DC
  Administered 2016-08-21 – 2016-08-22 (×2): 40 mg via ORAL
  Filled 2016-08-21 (×2): qty 1

## 2016-08-21 MED ORDER — MIDAZOLAM HCL 5 MG/5ML IJ SOLN
INTRAMUSCULAR | Status: DC | PRN
  Administered 2016-08-21: 2 mg via INTRAVENOUS

## 2016-08-21 MED ORDER — PROPOFOL 10 MG/ML IV BOLUS
INTRAVENOUS | Status: DC | PRN
  Administered 2016-08-21: 200 mg via INTRAVENOUS

## 2016-08-21 MED ORDER — HYDROMORPHONE HCL 1 MG/ML IJ SOLN
0.2500 mg | INTRAMUSCULAR | Status: DC | PRN
  Administered 2016-08-21 (×4): 0.5 mg via INTRAVENOUS

## 2016-08-21 MED ORDER — CEFAZOLIN SODIUM-DEXTROSE 2-4 GM/100ML-% IV SOLN
2.0000 g | INTRAVENOUS | Status: AC
  Administered 2016-08-21: 2 g via INTRAVENOUS
  Filled 2016-08-21: qty 100

## 2016-08-21 MED ORDER — ACETAMINOPHEN 325 MG PO TABS: 650.0000 mg | ORAL_TABLET | Freq: Four times a day (QID) | ORAL | Status: DC | PRN

## 2016-08-21 MED ORDER — SORBITOL 70 % SOLN: 30.0000 mL | Freq: Every day | Status: DC | PRN

## 2016-08-21 MED ORDER — MAGNESIUM CITRATE PO SOLN: 1.0000 | Freq: Once | ORAL | Status: DC | PRN

## 2016-08-21 MED ORDER — MORPHINE SULFATE (PF) 2 MG/ML IV SOLN
1.0000 mg | INTRAVENOUS | Status: DC | PRN
  Administered 2016-08-21 – 2016-08-22 (×6): 1 mg via INTRAVENOUS
  Filled 2016-08-21 (×6): qty 1

## 2016-08-21 MED ORDER — FENTANYL CITRATE (PF) 100 MCG/2ML IJ SOLN
INTRAMUSCULAR | Status: DC | PRN
  Administered 2016-08-21: 100 ug via INTRAVENOUS
  Administered 2016-08-21: 350 ug via INTRAVENOUS
  Administered 2016-08-21: 50 ug via INTRAVENOUS
  Administered 2016-08-21: 100 ug via INTRAVENOUS
  Administered 2016-08-21 (×6): 50 ug via INTRAVENOUS

## 2016-08-21 MED ORDER — ONDANSETRON HCL 4 MG PO TABS: 4.0000 mg | ORAL_TABLET | Freq: Four times a day (QID) | ORAL | Status: DC | PRN

## 2016-08-21 MED ORDER — ONDANSETRON HCL 4 MG/2ML IJ SOLN: 4.0000 mg | Freq: Four times a day (QID) | INTRAMUSCULAR | Status: DC | PRN

## 2016-08-21 MED ORDER — ARTIFICIAL TEARS OP OINT
TOPICAL_OINTMENT | OPHTHALMIC | Status: AC
  Filled 2016-08-21: qty 7

## 2016-08-21 MED ORDER — PHENYLEPHRINE 40 MCG/ML (10ML) SYRINGE FOR IV PUSH (FOR BLOOD PRESSURE SUPPORT)
PREFILLED_SYRINGE | INTRAVENOUS | Status: AC
  Filled 2016-08-21: qty 10

## 2016-08-21 MED ORDER — LIDOCAINE HCL (CARDIAC) 20 MG/ML IV SOLN
INTRAVENOUS | Status: DC | PRN
  Administered 2016-08-21: 20 mg via INTRAVENOUS

## 2016-08-21 MED ORDER — MEGESTROL ACETATE 40 MG/ML PO SUSP
400.0000 mg | Freq: Every day | ORAL | Status: DC
  Administered 2016-08-21 – 2016-08-22 (×2): 400 mg via ORAL
  Filled 2016-08-21 (×2): qty 10

## 2016-08-21 MED ORDER — HYDROMORPHONE HCL 1 MG/ML IJ SOLN
INTRAMUSCULAR | Status: AC
  Filled 2016-08-21: qty 1

## 2016-08-21 MED ORDER — LACTATED RINGERS IV SOLN
INTRAVENOUS | Status: DC
Start: 2016-08-21 — End: 2016-08-21

## 2016-08-21 MED ORDER — CEFAZOLIN SODIUM-DEXTROSE 2-4 GM/100ML-% IV SOLN
2.0000 g | Freq: Four times a day (QID) | INTRAVENOUS | Status: AC
  Administered 2016-08-21 – 2016-08-22 (×3): 2 g via INTRAVENOUS
  Filled 2016-08-21 (×3): qty 100

## 2016-08-21 MED ORDER — CEPHALEXIN 500 MG PO CAPS: 500.0000 mg | ORAL_CAPSULE | Freq: Four times a day (QID) | ORAL | Status: DC

## 2016-08-21 MED ORDER — CELECOXIB 200 MG PO CAPS
200.0000 mg | ORAL_CAPSULE | Freq: Two times a day (BID) | ORAL | Status: DC
  Administered 2016-08-21 – 2016-08-22 (×2): 200 mg via ORAL
  Filled 2016-08-21 (×2): qty 1

## 2016-08-21 MED ORDER — MIDAZOLAM HCL 2 MG/2ML IJ SOLN
INTRAMUSCULAR | Status: AC
  Filled 2016-08-21: qty 2

## 2016-08-21 MED ORDER — ONDANSETRON HCL 4 MG/2ML IJ SOLN
INTRAMUSCULAR | Status: DC | PRN
  Administered 2016-08-21: 4 mg via INTRAVENOUS

## 2016-08-21 MED ORDER — ROCURONIUM BROMIDE 100 MG/10ML IV SOLN
INTRAVENOUS | Status: DC | PRN
  Administered 2016-08-21: 5 mg via INTRAVENOUS
  Administered 2016-08-21: 50 mg via INTRAVENOUS
  Administered 2016-08-21 (×2): 10 mg via INTRAVENOUS

## 2016-08-21 MED ORDER — ONDANSETRON HCL 4 MG PO TABS: 4.0000 mg | ORAL_TABLET | Freq: Three times a day (TID) | ORAL | Status: DC | PRN

## 2016-08-21 MED ORDER — PHENYLEPHRINE HCL 10 MG/ML IJ SOLN
INTRAMUSCULAR | Status: DC | PRN
  Administered 2016-08-21: 25 ug/min via INTRAVENOUS

## 2016-08-21 MED ORDER — OXYCODONE HCL ER 10 MG PO T12A
10.0000 mg | EXTENDED_RELEASE_TABLET | Freq: Two times a day (BID) | ORAL | Status: DC
  Administered 2016-08-21 – 2016-08-22 (×2): 10 mg via ORAL
  Filled 2016-08-21 (×2): qty 1

## 2016-08-21 MED ORDER — GABAPENTIN 300 MG PO CAPS
900.0000 mg | ORAL_CAPSULE | Freq: Four times a day (QID) | ORAL | Status: DC
  Administered 2016-08-21 – 2016-08-22 (×2): 900 mg via ORAL
  Filled 2016-08-21 (×2): qty 3

## 2016-08-21 MED ORDER — BUDESONIDE 0.25 MG/2ML IN SUSP
0.2500 mg | Freq: Two times a day (BID) | RESPIRATORY_TRACT | Status: DC
  Filled 2016-08-21: qty 2

## 2016-08-21 MED ORDER — SUGAMMADEX SODIUM 200 MG/2ML IV SOLN
INTRAVENOUS | Status: DC | PRN
  Administered 2016-08-21: 100 mg via INTRAVENOUS

## 2016-08-21 MED ORDER — METHOCARBAMOL 1000 MG/10ML IJ SOLN
500.0000 mg | Freq: Four times a day (QID) | INTRAVENOUS | Status: DC | PRN
  Filled 2016-08-21: qty 5

## 2016-08-21 MED ORDER — ALBUTEROL SULFATE HFA 108 (90 BASE) MCG/ACT IN AERS
INHALATION_SPRAY | RESPIRATORY_TRACT | Status: AC
  Filled 2016-08-21: qty 6.7

## 2016-08-21 MED ORDER — PROMETHAZINE HCL 25 MG/ML IJ SOLN: 6.2500 mg | INTRAMUSCULAR | Status: DC | PRN

## 2016-08-21 MED ORDER — 0.9 % SODIUM CHLORIDE (POUR BTL) OPTIME
TOPICAL | Status: DC | PRN
  Administered 2016-08-21 (×6): 1000 mL

## 2016-08-21 MED ORDER — ACETAMINOPHEN 500 MG PO TABS: 1000.0000 mg | ORAL_TABLET | Freq: Four times a day (QID) | ORAL | Status: DC | PRN

## 2016-08-21 SURGICAL SUPPLY — 85 items
BANDAGE ACE 4X5 VEL STRL LF (GAUZE/BANDAGES/DRESSINGS) ×2 IMPLANT
BANDAGE ACE 6X5 VEL STRL LF (GAUZE/BANDAGES/DRESSINGS) ×2 IMPLANT
BIT DRILL 2.0 (BIT) ×2 IMPLANT
BIT DRILL 2.5X2.75 QC CALB (BIT) ×2 IMPLANT
BIT DRILL CALIBRATED 2.7 (BIT) ×1 IMPLANT
BIT DRILL CALIBRATED 2.7MM (BIT) ×1
CLOSURE WOUND 1/2 X4 (GAUZE/BANDAGES/DRESSINGS) ×7
COVER SURGICAL LIGHT HANDLE (MISCELLANEOUS) ×3 IMPLANT
DRAPE C-ARM 42X72 X-RAY (DRAPES) ×3 IMPLANT
DRAPE IMP U-DRAPE 54X76 (DRAPES) ×3 IMPLANT
DRAPE INCISE IOBAN 66X45 STRL (DRAPES) IMPLANT
DRAPE SURG 17X23 STRL (DRAPES) ×7 IMPLANT
DRAPE U-SHAPE 47X51 STRL (DRAPES) ×3 IMPLANT
DRILL 2.6X220MM LONG AO (BIT) ×2 IMPLANT
DRILL SLEEVE 2.7 DIST TIB (TRAUMA) ×2
DRSG TEGADERM 4X4.75 (GAUZE/BANDAGES/DRESSINGS) ×3 IMPLANT
DURAPREP 26ML APPLICATOR (WOUND CARE) ×3 IMPLANT
ELECT BLADE 4.0 EZ CLEAN MEGAD (MISCELLANEOUS) ×3
ELECT CAUTERY BLADE 6.4 (BLADE) ×3 IMPLANT
ELECT REM PT RETURN 9FT ADLT (ELECTROSURGICAL) ×3
ELECTRODE BLDE 4.0 EZ CLN MEGD (MISCELLANEOUS) ×1 IMPLANT
ELECTRODE REM PT RTRN 9FT ADLT (ELECTROSURGICAL) ×1 IMPLANT
FACESHIELD WRAPAROUND (MASK) IMPLANT
FACESHIELD WRAPAROUND OR TEAM (MASK) IMPLANT
GAUZE SPONGE 4X4 12PLY STRL (GAUZE/BANDAGES/DRESSINGS) ×2 IMPLANT
GAUZE XEROFORM 1X8 LF (GAUZE/BANDAGES/DRESSINGS) ×3 IMPLANT
GLOVE BIOGEL PI IND STRL 6 (GLOVE) IMPLANT
GLOVE BIOGEL PI IND STRL 7.5 (GLOVE) IMPLANT
GLOVE BIOGEL PI INDICATOR 6 (GLOVE) ×6
GLOVE BIOGEL PI INDICATOR 7.5 (GLOVE) ×6
GLOVE SKINSENSE NS SZ7.5 (GLOVE) ×6
GLOVE SKINSENSE STRL SZ7.5 (GLOVE) ×2 IMPLANT
GLOVE SURG SS PI 6.0 STRL IVOR (GLOVE) ×6 IMPLANT
GLOVE SURG SS PI 6.5 STRL IVOR (GLOVE) ×2 IMPLANT
GOWN STRL REIN XL XLG (GOWN DISPOSABLE) ×8 IMPLANT
HYDROGEN PEROXIDE 16OZ (MISCELLANEOUS) ×2 IMPLANT
K-WIRE OLIVE STOP (WIRE) ×6
KIT BASIN OR (CUSTOM PROCEDURE TRAY) ×3 IMPLANT
KIT ROOM TURNOVER OR (KITS) ×3 IMPLANT
KWIRE OLIVE STOP (WIRE) IMPLANT
LOOP VESSEL MAXI BLUE (MISCELLANEOUS) ×2 IMPLANT
MANIFOLD NEPTUNE II (INSTRUMENTS) ×3 IMPLANT
NDL HYPO 25GX1X1/2 BEV (NEEDLE) IMPLANT
NEEDLE HYPO 25GX1X1/2 BEV (NEEDLE) IMPLANT
NS IRRIG 1000ML POUR BTL (IV SOLUTION) ×15 IMPLANT
PACK SHOULDER (CUSTOM PROCEDURE TRAY) ×3 IMPLANT
PAD ARMBOARD 7.5X6 YLW CONV (MISCELLANEOUS) ×6 IMPLANT
PAD CAST 4YDX4 CTTN HI CHSV (CAST SUPPLIES) IMPLANT
PADDING CAST COTTON 4X4 STRL (CAST SUPPLIES) ×3
PLATE 5H SUPERIOR LATERAL RT (Plate) ×2 IMPLANT
PLATE OLECRANON LRG (Plate) ×2 IMPLANT
SCREW BONE 3.5X12 (Screw) ×2 IMPLANT
SCREW BONE 3.5X14MM (Screw) ×4 IMPLANT
SCREW BONE 3.5X16MM (Screw) ×4 IMPLANT
SCREW CORT 3.5X32 (Screw) ×3 IMPLANT
SCREW CORT T15 24X3.5XST LCK (Screw) IMPLANT
SCREW CORT T15 28X3.5XST LCK (Screw) IMPLANT
SCREW CORT T15 32X3.5XST LCK (Screw) IMPLANT
SCREW CORT T15 TPR 55X3.5XST (Screw) IMPLANT
SCREW CORTICAL 3.5X24MM (Screw) ×3 IMPLANT
SCREW CORTICAL 3.5X28MM (Screw) ×3 IMPLANT
SCREW CORTICAL 3.5X55MM (Screw) ×3 IMPLANT
SCREW CORTICAL LOW PROF 3.5X20 (Screw) ×6 IMPLANT
SCREW LOCK CORT STAR 3.5X16 (Screw) ×2 IMPLANT
SCREW LOCK CORT STAR 3.5X20 (Screw) ×2 IMPLANT
SCREW LOCK CORT STAR 3.5X22 (Screw) ×2 IMPLANT
SCREW LOCKING 12X3.5MM (Screw) ×4 IMPLANT
SCREW LOCKING 2.7X12MM (Screw) ×2 IMPLANT
SCREW LOCKING 2.7X14MM (Screw) ×4 IMPLANT
SLEEVE DRILL 2.7 DIST TIB (TRAUMA) IMPLANT
SLING ARM FOAM STRAP LRG (SOFTGOODS) ×2 IMPLANT
SPONGE LAP 18X18 X RAY DECT (DISPOSABLE) ×8 IMPLANT
STRIP CLOSURE SKIN 1/2X4 (GAUZE/BANDAGES/DRESSINGS) ×8 IMPLANT
SUCTION FRAZIER HANDLE 10FR (MISCELLANEOUS) ×2
SUCTION TUBE FRAZIER 10FR DISP (MISCELLANEOUS) ×1 IMPLANT
SUT MNCRL 3 0 RB1 (SUTURE) IMPLANT
SUT MNCRL AB 4-0 PS2 18 (SUTURE) ×1 IMPLANT
SUT MONOCRYL 3 0 RB1 (SUTURE) ×4
SUT VIC AB 2-0 CT1 27 (SUTURE) ×6
SUT VIC AB 2-0 CT1 TAPERPNT 27 (SUTURE) ×1 IMPLANT
SUT VICRYL 0 CT 1 36IN (SUTURE) ×7 IMPLANT
SYR CONTROL 10ML LL (SYRINGE) IMPLANT
WASHER 3.5MM (Orthopedic Implant) ×4 IMPLANT
WATER STERILE IRR 1000ML POUR (IV SOLUTION) ×3 IMPLANT
WIRE K 1.6MM 144256 (MISCELLANEOUS) ×8 IMPLANT

## 2016-08-21 NOTE — Anesthesia Procedure Notes (Signed)
Procedure Name: Intubation Date/Time: 08/21/2016 1:48 PM Performed by: Suzy Bouchard Pre-anesthesia Checklist: Patient identified, Suction available, Emergency Drugs available, Patient being monitored and Timeout performed Patient Re-evaluated:Patient Re-evaluated prior to inductionOxygen Delivery Method: Circle system utilized Preoxygenation: Pre-oxygenation with 100% oxygen Intubation Type: IV induction Ventilation: Mask ventilation without difficulty Laryngoscope Size: Miller and 2 Grade View: Grade I Tube type: Oral Tube size: 7.5 mm Number of attempts: 1 Airway Equipment and Method: Stylet and LTA kit utilized Placement Confirmation: ETT inserted through vocal cords under direct vision,  CO2 detector and breath sounds checked- equal and bilateral Secured at: 23 cm Tube secured with: Tape Dental Injury: Teeth and Oropharynx as per pre-operative assessment

## 2016-08-21 NOTE — Op Note (Signed)
Date of Surgery: 08/21/2016  INDICATIONS: [name] is a 45 y.o.-year-old male with a right clavicle and olecranon fracture;  The patient did consent to the procedure after discussion of the risks and benefits.  PREOPERATIVE DIAGNOSIS:  1. Right clavicle fracture s/p ORIF 2. Right olecranon fracture  POSTOPERATIVE DIAGNOSIS: Same.  PROCEDURE: 1. Revision ORIF of right clavicle fracture 2. ORIF right olecranon fracture 3. Complex wound closure right chest, 15 cm  SURGEON: N. Glee ArvinMichael Johathon Overturf, M.D.  ASSIST: April Chilton SiGreen, RNFA.  ANESTHESIA:  general  IV FLUIDS AND URINE: See anesthesia.  ESTIMATED BLOOD LOSS: 200 mL.  IMPLANTS: Stryker lateral clavicle plate, Biomet olecranon plate  DRAINS: none  COMPLICATIONS: None.  DESCRIPTION OF PROCEDURE: The patient was brought to the operating room and placed supine on the operating table.  The patient had been signed prior to the procedure and this was documented. The patient had the anesthesia placed by the anesthesiologist.  A time-out was performed to confirm that this was the correct patient, site, side and location. The patient did receive antibiotics prior to the incision and was re-dosed during the procedure as needed at indicated intervals.  A tourniquet was placed for the olecranon portion of the surgery.  The patient had the operative extremity prepped and draped in the standard surgical fashion.    I first made an incision over the previous incision over the clavicle. Full-thickness flaps were elevated. Dissection was carried down to the clavicle. We took out the retained hardware from the distal clavicle and from the medial clavicle.  The peri-implant fracture had propagated laterally. I do not feel that I could retain the existing hardware. Therefore all the hardware was taken out. I then obtained a reduction and placed the appropriate superior lateral clavicle plate on the appropriate position. This was confirmed on fluoroscopy. With  the fracture reduced I then placed locking and nonlocking screws both medially and laterally from the fracture. I was not able to lag the fracture given the orientation of the fracture. X-rays were used to confirm appropriate hardware placement. Care was taken not to plunge with the drill or the screw driver. After this was done the wound was thoroughly irrigated. The patient had significant scar tissue and tenuous skin which I had to sharply excise and then performed a complex wound closure. Sterile dressings were applied. I then turned my attention to the right elbow. The extremity was exsanguinated and the tourniquet was inflated to 250 mmHg. A posterior incision over the proximal ulna was created. Dissection was taken down to the bone. Periosteum was elevated. The fracture was exposed. A small amount of the triceps attachment was elevated off of the olecranon order to provide a spot for the hardware. He did have significant comminution especially of the articular surface. I was able to replace the main articular portion back into its native position. I then reduced the fracture. I then compressed the fracture using a clamp. I placed the appropriate sized plate on the olecranon. With the fracture reduced I then placed a screw through the compression slot in order to compress the fracture. I then placed another nonlocking screw through the proximal ulna to hold the plate. The K wires were removed. I then placed a homerun screw in order to gain additional compression across the fracture and the joint line. The articular surface remained congruent. I then placed locking and nonlocking screws through the rest of the plate in the appropriate position using fluoroscopic guidance. After this was done final  x-rays were taken. The olecranon and the proximal ulna moved as a unit. The wound was thoroughly irrigated. Hemostasis was obtained. The wound was closed in layer fashion using 0 Vicryl, 2-0 Vicryl, 3-0 Monocryl.  Steri-Strips were applied. Sterile dressings were applied. Long-arm splint was then placed. Patient tolerated procedure well and no immediate complications   POSTOPERATIVE PLAN: Patient will be not weightbearing to the right upper extremity and admitted overnight for pain control and discharge in the morning.   Samuel ReelN. [name] [name], MD [facility] [phone] 5:06 PM

## 2016-08-21 NOTE — Anesthesia Preprocedure Evaluation (Addendum)
Anesthesia Evaluation  Patient identified by MRN, date of birth, ID band Patient awake    Reviewed: Allergy & Precautions, NPO status , Patient's Chart, lab work & pertinent test results, reviewed documented beta blocker date and time   Airway Mallampati: I  TM Distance: >3 FB Neck ROM: Full    Dental  (+) Teeth Intact, Dental Advisory Given, Partial Upper   Pulmonary asthma ,    breath sounds clear to auscultation       Cardiovascular negative cardio ROS   Rhythm:Regular Rate:Normal     Neuro/Psych PSYCHIATRIC DISORDERS Anxiety Depression Bipolar Disorder negative neurological ROS     GI/Hepatic Neg liver ROS, GERD  Medicated,  Endo/Other  negative endocrine ROS  Renal/GU negative Renal ROS  negative genitourinary   Musculoskeletal  (+) Arthritis , Osteoarthritis,    Abdominal   Peds negative pediatric ROS (+)  Hematology negative hematology ROS (+)   Anesthesia Other Findings   Reproductive/Obstetrics negative OB ROS                          Lab Results  Component Value Date   WBC 25.3 (H) 08/14/2016   HGB 14.5 08/14/2016   HCT 44.1 08/14/2016   MCV 89.1 08/14/2016   PLT 458 (H) 08/14/2016   Lab Results  Component Value Date   CREATININE 0.80 08/14/2016   BUN 12 08/14/2016   NA 138 08/14/2016   K 3.5 08/14/2016   CL 108 08/14/2016   CO2 22 08/14/2016   Lab Results  Component Value Date   INR 1.11 07/22/2015   INR 1.1 04/26/2009   06/2016 EKG: normal sinus rhythm.   Anesthesia Physical Anesthesia Plan  ASA: II  Anesthesia Plan: General   Post-op Pain Management:    Induction: Intravenous  Airway Management Planned: Oral ETT  Additional Equipment:   Intra-op Plan:   Post-operative Plan: Extubation in OR  Informed Consent: I have reviewed the patients History and Physical, chart, labs and discussed the procedure including the risks, benefits and alternatives  for the proposed anesthesia with the patient or authorized representative who has indicated his/her understanding and acceptance.   Dental advisory given  Plan Discussed with: CRNA  Anesthesia Plan Comments: (No regional block. The site of injection is erythematous and warm. )       Anesthesia Quick Evaluation

## 2016-08-21 NOTE — Discharge Instructions (Signed)
Postoperative instructions: ° °Weightbearing: non weight bearing ° °Dressing instructions: Keep your dressing and/or splint clean and dry at all times.  It will be removed at your first post-operative appointment.  Your stitches and/or staples will be removed at this visit. ° °Incision instructions:  Do not soak your incision for 3 weeks after surgery.  If the incision gets wet, pat dry and do not scrub the incision. ° °Pain control:  You have been given a prescription to be taken as directed for post-operative pain control.  In addition, elevate the operative extremity above the heart at all times to prevent swelling and throbbing pain. ° °Take over-the-counter Colace, 100mg by mouth twice a day while taking narcotic pain medications to help prevent constipation. ° °Follow up appointments: °1) 10-14 days for suture removal and wound check. °2) Dr. Koy Lamp as scheduled. ° ° ------------------------------------------------------------------------------------------------------------- ° °After Surgery Pain Control: ° °After your surgery, post-surgical discomfort or pain is likely. This discomfort can last several days to a few weeks. At certain times of the day your discomfort may be more intense.  °Did you receive a nerve block?  °A nerve block can provide pain relief for one hour to two days after your surgery. As long as the nerve block is working, you will experience little or no sensation in the area the surgeon operated on.  °As the nerve block wears off, you will begin to experience pain or discomfort. It is very important that you begin taking your prescribed pain medication before the nerve block fully wears off. Treating your pain at the first sign of the block wearing off will ensure your pain is better controlled and more tolerable when full-sensation returns. Do not wait until the pain is intolerable, as the medicine will be less effective. It is better to treat pain in advance than to try and catch up.    °General Anesthesia:  °If you did not receive a nerve block during your surgery, you will need to start taking your pain medication shortly after your surgery and should continue to do so as prescribed by your surgeon.  °Pain Medication:  °Most commonly we prescribe Vicodin and Percocet for post-operative pain. Both of these medications contain a combination of acetaminophen (Tylenol®) and a narcotic to help control pain.  °· It takes between 30 and 45 minutes before pain medication starts to work. It is important to take your medication before your pain level gets too intense.  °· Nausea is a common side effect of many pain medications. You will want to eat something before taking your pain medicine to help prevent nausea.  °· If you are taking a prescription pain medication that contains acetaminophen, we recommend that you do not take additional over the counter acetaminophen (Tylenol®).  °Other pain relieving options:  °· Using a cold pack to ice the affected area a few times a day (15 to 20 minutes at a time) can help to relieve pain, reduce swelling and bruising.  °· Elevation of the affected area can also help to reduce pain and swelling. ° ° ° °

## 2016-08-21 NOTE — H&P (Signed)
PREOPERATIVE H&P  Chief Complaint: Right clavicle fracture, Right olecranon fracture  HPI: Samuel Barrett is a 45 y.o. male who presents for surgical treatment of Right clavicle fracture, Right olecranon fracture.  He denies any changes in medical history.  Past Medical History:  Diagnosis Date  . Anemia    hx of-  . Anxiety   . Arthritis   . Asthma   . Chronic back pain   . GERD (gastroesophageal reflux disease)   . History of blood transfusion   . History of GI bleed   . History of kidney stones   . Joint pain   . Joint swelling   . Pneumonia    last time 2015  . Pneumothorax 2000  . Psoriasis    "in the family"  . Subdural hematoma [facility](HCC)    Past Surgical History:  Procedure Laterality Date  . CARPAL TUNNEL RELEASE Right   . COLONOSCOPY WITH ESOPHAGOGASTRODUODENOSCOPY (EGD)    . JOINT REPLACEMENT Right    knee  . LEG SURGERY     left arthroscopy with screws   . ORIF CLAVICULAR FRACTURE Right 08/07/2016   Procedure: OPEN REDUCTION INTERNAL FIXATION (ORIF) RIGHT CLAVICLE FRACTURE;  Surgeon: Tarry KosNaiping M Xu, MD;  Location: [facility] OR;  Service: Orthopedics;  Laterality: Right;   Social History   Social History  . Marital status: Single    Spouse name: N/A  . Number of children: N/A  . Years of education: N/A   Social History Main Topics  . Smoking status: Never Smoker  . Smokeless tobacco: Current User    Types: Chew     Comment: .75 of a can in a day  . Alcohol use Yes     Comment: rarely  . Drug use: No  . Sexual activity: Not Asked   Other Topics Concern  . None   Social History Narrative  . None   History reviewed. No pertinent family history. Allergies  Allergen Reactions  . Ibuprofen Hives    REACTION: hives  . Ketorolac Tromethamine Hives    REACTION: hives   Prior to Admission medications   Medication Sig Start Date End Date Taking? Authorizing Provider  omeprazole (PRILOSEC) 20 MG capsule Take 20 mg by mouth 2 (two) times daily before a  meal.   Yes Historical Provider, MD  acetaminophen (TYLENOL) 500 MG tablet Take 1,000 mg by mouth every 6 (six) hours as needed for moderate pain.    Historical Provider, MD  albuterol (PROVENTIL HFA) 108 (90 Base) MCG/ACT inhaler Inhale 2 puffs into the lungs every 6 (six) hours as needed for wheezing or shortness of breath.    Historical Provider, MD  beclomethasone (QVAR) 40 MCG/ACT inhaler Inhale 2 puffs into the lungs 2 (two) times daily as needed (for symptoms).    Historical Provider, MD  cephALEXin (KEFLEX) 500 MG capsule Take 1 capsule (500 mg total) by mouth 4 (four) times daily. 08/14/16   [name], MD  gabapentin (NEURONTIN) 300 MG capsule Take 900 mg by mouth 4 (four) times daily. 11/11/15 11/10/16  Historical Provider, MD  megestrol (MEGACE) 40 MG/ML suspension Take 400 mg by mouth daily. 07/31/16 07/31/17  Historical Provider, MD  methocarbamol (ROBAXIN) 750 MG tablet Take 1 tablet (750 mg total) by mouth 2 (two) times daily as needed for muscle spasms. 08/07/16   [name], MD  morphine (MSIR) 15 MG tablet Take 1 tablet (15 mg total) by mouth every 4 (four) hours as needed for severe pain.  08/01/16   [name], DO  ondansetron (ZOFRAN) 4 MG tablet Take 1-2 tablets (4-8 mg total) by mouth every 8 (eight) hours as needed for nausea or vomiting. 08/07/16   Tarry KosNaiping M Xu, MD  oxyCODONE (OXYCONTIN) 10 mg 12 hr tablet Take 1 tablet (10 mg total) by mouth every 12 (twelve) hours. 08/07/16   Tarry KosNaiping M Xu, MD  oxyCODONE-acetaminophen (PERCOCET) 7.5-325 MG tablet Take 1 tablet by mouth every 6 (six) hours as needed for severe pain. 08/18/16   Hope Orlene OchM Neese, NP  senna-docusate (SENOKOT S) 8.6-50 MG tablet Take 1 tablet by mouth at bedtime as needed. 08/07/16   Tarry KosNaiping M Xu, MD     Positive ROS: All other systems have been reviewed and were otherwise negative with the exception of those mentioned in the HPI and as above.  Physical Exam: General: Alert, no acute distress Cardiovascular: No pedal  edema Respiratory: No cyanosis, no use of accessory musculature GI: abdomen soft Skin: No lesions in the area of chief complaint Neurologic: Sensation intact distally Psychiatric: Patient is competent for consent with normal mood and affect Lymphatic: no lymphedema  MUSCULOSKELETAL: exam stable  Assessment: Right clavicle fracture, Right olecranon fracture  Plan: Plan for Procedure(s): REVISION OPEN REDUCTION INTERNAL FIXATION (ORIF) RIGHT CLAVICLE FRACTURE AND OPEN REDUCTION INTERNAL FIXATION RIGHT OLECRANON OPEN REDUCTION INTERNAL FIXATION (ORIF) ELBOW/OLECRANON FRACTURE  The risks benefits and alternatives were discussed with the patient including but not limited to the risks of nonoperative treatment, versus surgical intervention including infection, bleeding, nerve injury,  blood clots, cardiopulmonary complications, morbidity, mortality, among others, and they were willing to proceed.   Cheral AlmasXu, Naiping [name], MD   08/21/2016 10:08 AM

## 2016-08-21 NOTE — Progress Notes (Signed)
C/o pain Dr Hart RochesterHollis informed no new orders at present.

## 2016-08-21 NOTE — Transfer of Care (Signed)
Immediate Anesthesia Transfer of Care Note  Patient: Samuel Barrett  Procedure(s) Performed: Procedure(s): REVISION OPEN REDUCTION INTERNAL FIXATION (ORIF) RIGHT CLAVICLE FRACTURE AND OPEN REDUCTION INTERNAL FIXATION RIGHT OLECRANON (Right) OPEN REDUCTION INTERNAL FIXATION (ORIF) ELBOW/OLECRANON FRACTURE (Right)  Patient Location: PACU  Anesthesia Type:General  Level of Consciousness: awake, alert  and oriented  Airway & Oxygen Therapy: Patient Spontanous Breathing and Patient connected to nasal cannula oxygen  Post-op Assessment: Report given to RN and Post -op Vital signs reviewed and stable  Post vital signs: Reviewed and stable  Last Vitals:  Vitals:   08/21/16 1009 08/21/16 1030  BP: (!) 136/103 136/85  Pulse: 95   Resp: 20   Temp: 36.7 C     Last Pain:  Vitals:   08/21/16 1058  TempSrc:   PainSc: 6          Complications: No apparent anesthesia complications

## 2016-08-22 ENCOUNTER — Encounter (HOSPITAL_COMMUNITY): Payer: Self-pay

## 2016-08-22 DIAGNOSIS — S42021A Displaced fracture of shaft of right clavicle, initial encounter for closed fracture: Secondary | ICD-10-CM | POA: Diagnosis not present

## 2016-08-22 NOTE — Progress Notes (Signed)
Went over discharge papers and medication with Mr Larna DaughtersBolen and his mother with full understanding
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
## 2016-08-22 NOTE — Anesthesia Postprocedure Evaluation (Signed)
Anesthesia Post Note  Patient: Samuel Barrett  Procedure(s) Performed: Procedure(s) (LRB): REVISION OPEN REDUCTION INTERNAL FIXATION (ORIF) RIGHT CLAVICLE FRACTURE AND OPEN REDUCTION INTERNAL FIXATION RIGHT OLECRANON (Right) OPEN REDUCTION INTERNAL FIXATION (ORIF) ELBOW/OLECRANON FRACTURE (Right)  Patient location during evaluation: PACU Anesthesia Type: General Level of consciousness: awake and alert, oriented and patient cooperative Pain management: pain level controlled (pain improving) Vital Signs Assessment: post-procedure vital signs reviewed and stable Respiratory status: spontaneous breathing, nonlabored ventilation, respiratory function stable and patient connected to nasal cannula oxygen Cardiovascular status: blood pressure returned to baseline and stable Postop Assessment: no signs of nausea or vomiting Anesthetic complications: no Comments: Delayed entry, pt eval in PACU post op    Last Vitals:  Vitals:   08/22/16 0506 08/22/16 1226  BP: 111/72 121/83  Pulse: (!) 108 94  Resp: 18 16  Temp: 37 C 36.7 C    Last Pain:  Vitals:   08/22/16 1226  TempSrc: Oral  PainSc:                  Shaasia Odle,E. Lamar Naef

## 2016-08-22 NOTE — Progress Notes (Signed)
   Subjective:  Patient reports pain as moderate.    Objective:   VITALS:   Vitals:   08/21/16 2126 08/21/16 2159 08/21/16 2300 08/22/16 0506  BP: (!) 148/94  130/86 111/72  Pulse:  79 88 (!) 108  Resp:  18 18 18   Temp:   98 F (36.7 C) 98.6 F (37 C)  TempSrc:   Oral Oral  SpO2:  99% 100% 100%  Weight:        Neurologically intact Neurovascular intact Sensation intact distally Intact pulses distally Incision: dressing C/D/I and no drainage No cellulitis present Compartment soft   Lab Results  Component Value Date   WBC 25.3 (H) 08/14/2016   HGB 14.5 08/14/2016   HCT 44.1 08/14/2016   MCV 89.1 08/14/2016   PLT 458 (H) 08/14/2016     Assessment/Plan:  1 Day Post-Op   - DVT ppx - SCDs, ambulation - NWB operative extremity - Pain control - Discharge planning - home today  Cheral AlmasXu, Tamani Durney [name] 08/22/2016, 7:35 AM [phone]

## 2016-08-26 ENCOUNTER — Emergency Department (HOSPITAL_COMMUNITY): Payer: Self-pay

## 2016-08-26 ENCOUNTER — Emergency Department (HOSPITAL_COMMUNITY)
Admission: EM | Admit: 2016-08-26 | Discharge: 2016-08-27 | Disposition: A | Discharge status: Home / Self Care | Payer: Self-pay | Attending: Emergency Medicine | Admitting: Emergency Medicine

## 2016-08-26 ENCOUNTER — Encounter (HOSPITAL_COMMUNITY): Payer: Self-pay | Admitting: Emergency Medicine

## 2016-08-26 DIAGNOSIS — J45909 Unspecified asthma, uncomplicated: Secondary | ICD-10-CM | POA: Insufficient documentation

## 2016-08-26 DIAGNOSIS — Z96651 Presence of right artificial knee joint: Secondary | ICD-10-CM | POA: Insufficient documentation

## 2016-08-26 DIAGNOSIS — Z79899 Other long term (current) drug therapy: Secondary | ICD-10-CM | POA: Insufficient documentation

## 2016-08-26 DIAGNOSIS — J189 Pneumonia, unspecified organism: Secondary | ICD-10-CM | POA: Insufficient documentation

## 2016-08-26 DIAGNOSIS — R079 Chest pain, unspecified: Secondary | ICD-10-CM | POA: Insufficient documentation

## 2016-08-26 LAB — CBC
HEMATOCRIT: 31.8 % — AB (ref 39.0–52.0)
HEMOGLOBIN: 10.4 g/dL — AB (ref 13.0–17.0)
MCH: 28.3 pg (ref 26.0–34.0)
MCHC: 32.7 g/dL (ref 30.0–36.0)
MCV: 86.6 fL (ref 78.0–100.0)
Platelets: 562 10*3/uL — ABNORMAL HIGH (ref 150–400)
RBC: 3.67 MIL/uL — ABNORMAL LOW (ref 4.22–5.81)
RDW: 13.7 % (ref 11.5–15.5)
WBC: 13.1 10*3/uL — ABNORMAL HIGH (ref 4.0–10.5)

## 2016-08-26 LAB — RAPID URINE DRUG SCREEN, HOSP PERFORMED
AMPHETAMINES: POSITIVE — AB
BARBITURATES: NOT DETECTED
BENZODIAZEPINES: NOT DETECTED
Cocaine: POSITIVE — AB
Opiates: NOT DETECTED
TETRAHYDROCANNABINOL: NOT DETECTED

## 2016-08-26 LAB — BASIC METABOLIC PANEL
ANION GAP: 14 (ref 5–15)
BUN: 12 mg/dL (ref 6–20)
CALCIUM: 8.8 mg/dL — AB (ref 8.9–10.3)
CO2: 19 mmol/L — AB (ref 22–32)
Chloride: 104 mmol/L (ref 101–111)
Creatinine, Ser: 0.59 mg/dL — ABNORMAL LOW (ref 0.61–1.24)
GFR calc Af Amer: >60 mL/min (ref >60)
GFR calc non Af Amer: >60 mL/min (ref >60)
GLUCOSE: 71 mg/dL (ref 65–99)
Potassium: 3.4 mmol/L — ABNORMAL LOW (ref 3.5–5.1)
Sodium: 137 mmol/L (ref 135–145)

## 2016-08-26 LAB — I-STAT TROPONIN, ED: TROPONIN I, POC: 0 ng/mL (ref 0.00–0.08)

## 2016-08-26 MED ORDER — SODIUM CHLORIDE 0.9 % IV BOLUS (SEPSIS)
1000.0000 mL | Freq: Once | INTRAVENOUS | Status: AC
  Administered 2016-08-26: 1000 mL via INTRAVENOUS

## 2016-08-26 MED ORDER — IOPAMIDOL (ISOVUE-370) INJECTION 76%
INTRAVENOUS | Status: AC
  Administered 2016-08-26: 100 mL
  Filled 2016-08-26: qty 100

## 2016-08-26 MED ORDER — FENTANYL CITRATE (PF) 100 MCG/2ML IJ SOLN
50.0000 ug | Freq: Once | INTRAMUSCULAR | Status: AC
  Administered 2016-08-26: 50 ug via INTRAVENOUS
  Filled 2016-08-26: qty 2

## 2016-08-26 NOTE — ED Provider Notes (Signed)
[facility]-EMERGENCY DEPT Provider Note   CSN: [id number] Arrival date & time: 08/26/16  2044     History   Chief Complaint Chief Complaint  Patient presents with  . Shortness of Breath  . Chest Pain    HPI Samuel Barrett is a 45 y.o. male who presents with chest pain and SOB. Poor historian. PMH significant for recent right clavicle fracture requiring ORIF on 8/23, GERD, asthma, substance abuse, bipolar disorder. He was seen for right upper extremity swelling on 8/19. Rx'ed pain medicine. Yesterday he started to have chest pain while sitting in his recliner. It is central and non-radiating. Constant, feels sharp like an "ice pick". Nothing has made it better or worse. Associated with SOB. Feels like he needs to cough but he report it hurts. Denies fever, chills, leg swelling, cough, wheezing, abdominal pain, N/V. Denies drug use other than his prescribed medicines.  HPI  Past Medical History:  Diagnosis Date  . Anemia    hx of-  . Anxiety   . Arthritis   . Asthma   . Chronic back pain   . GERD (gastroesophageal reflux disease)   . History of blood transfusion   . History of GI bleed   . History of kidney stones   . Pneumonia    last time 2015  . Pneumothorax 2000  . Psoriasis    "in the family"  . Subdural hematoma [facility])     Patient Active Problem List   Diagnosis Date Noted  . S/P ORIF (open reduction internal fixation) fracture 08/21/2016  . Altered mental status 07/21/2015  . OD (overdose of drug) 07/21/2015  . Microcytic hypochromic anemia 07/21/2015  . Cocaine abuse 07/21/2015  . Bipolar affective (HCC) 07/21/2015  . Abnormal EKG 07/21/2015  . 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 Cocaine abuse with cocaine-induced mood disorder (HCC) 07/21/2015  . DEPRESSION 06/22/2009  . BIPOLAR DISORDER UNSPECIFIED 01/26/2009  . ACUTE BRONCHITIS 10/19/2008  . OSTEOARTHRITIS 08/23/2008  . WEIGHT LOSS 08/23/2008  . NEPHROLITHIASIS, HX OF 08/23/2008  .  ASTHMA 08/12/2008  . GERD 08/12/2008  . Contact dermatitis and other eczema, due to unspecified cause 08/12/2008  . KNEE PAIN, RIGHT 08/12/2008  . Other postprocedural status(V45.89) 08/12/2008  . 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 TIBIAL FRACTURE 12/30/1998    Past Surgical History:  Procedure Laterality Date  . CARPAL TUNNEL RELEASE Right   . COLONOSCOPY WITH ESOPHAGOGASTRODUODENOSCOPY (EGD)    . JOINT REPLACEMENT Right    knee  . LEG SURGERY     left arthroscopy with screws   . ORIF CLAVICULAR FRACTURE Right 08/07/2016   Procedure: OPEN REDUCTION INTERNAL FIXATION (ORIF) RIGHT CLAVICLE FRACTURE;  Surgeon: Tarry Kos, MD;  Location: [facility] OR;  Service: Orthopedics;  Laterality: Right;  . ORIF CLAVICULAR FRACTURE Right 08/21/2016   Procedure: REVISION OPEN REDUCTION INTERNAL FIXATION (ORIF) RIGHT CLAVICLE FRACTURE AND OPEN REDUCTION INTERNAL FIXATION RIGHT OLECRANON;  Surgeon: Tarry Kos, MD;  Location: [facility] OR;  Service: Orthopedics;  Laterality: Right;  . ORIF ELBOW FRACTURE Right 08/21/2016   Procedure: OPEN REDUCTION INTERNAL FIXATION (ORIF) ELBOW/OLECRANON FRACTURE;  Surgeon: Tarry Kos, MD;  Location: [facility] OR;  Service: Orthopedics;  Laterality: Right;       Home Medications    Prior to Admission medications   Medication Sig Start Date End Date Taking? Authorizing Provider  acetaminophen (TYLENOL) 500 MG tablet Take 1,000 mg by mouth every 6 (six) hours as needed for moderate pain.  Historical Provider, MD  albuterol (PROVENTIL HFA) 108 (90 Base) MCG/ACT inhaler Inhale 2 puffs into the lungs every 6 (six) hours as needed for wheezing or shortness of breath.    Historical Provider, MD  beclomethasone (QVAR) 40 MCG/ACT inhaler Inhale 2 puffs into the lungs 2 (two) times daily as needed (for symptoms).    Historical Provider, MD  cephALEXin (KEFLEX) 500 MG capsule Take 1 capsule (500 mg total) by mouth 4 (four) times daily. 08/14/16   [name], MD  gabapentin  (NEURONTIN) 300 MG capsule Take 900 mg by mouth 4 (four) times daily. 11/11/15 11/10/16  Historical Provider, MD  megestrol (MEGACE) 40 MG/ML suspension Take 400 mg by mouth daily. 07/31/16 07/31/17  Historical Provider, MD  methocarbamol (ROBAXIN) 750 MG tablet Take 1 tablet (750 mg total) by mouth 2 (two) times daily as needed for muscle spasms. 08/07/16   [name], MD  methocarbamol (ROBAXIN) 750 MG tablet Take 1 tablet (750 mg total) by mouth 2 (two) times daily as needed for muscle spasms. 08/21/16   Tarry KosNaiping M Xu, MD  morphine (MSIR) 15 MG tablet Take 1 tablet (15 mg total) by mouth every 4 (four) hours as needed for severe pain. 08/01/16   [name], DO  omeprazole (PRILOSEC) 20 MG capsule Take 20 mg by mouth 2 (two) times daily before a meal.    Historical Provider, MD  ondansetron (ZOFRAN) 4 MG tablet Take 1-2 tablets (4-8 mg total) by mouth every 8 (eight) hours as needed for nausea or vomiting. 08/07/16   Tarry KosNaiping M Xu, MD  ondansetron (ZOFRAN) 4 MG tablet Take 1-2 tablets (4-8 mg total) by mouth every 8 (eight) hours as needed for nausea or vomiting. 08/21/16   Tarry KosNaiping M Xu, MD  oxyCODONE (OXYCONTIN) 10 mg 12 hr tablet Take 1 tablet (10 mg total) by mouth every 12 (twelve) hours. 08/07/16   Tarry KosNaiping M Xu, MD  oxyCODONE (OXYCONTIN) 10 mg 12 hr tablet Take 1 tablet (10 mg total) by mouth every 12 (twelve) hours. 08/21/16   Tarry KosNaiping M Xu, MD  oxyCODONE-acetaminophen (PERCOCET) 10-325 MG tablet Take 1 tablet by mouth every 4 (four) hours as needed for pain. 08/21/16   Tarry KosNaiping M Xu, MD  senna-docusate (SENOKOT S) 8.6-50 MG tablet Take 1 tablet by mouth at bedtime as needed. 08/07/16   [name], MD  senna-docusate (SENOKOT S) 8.6-50 MG tablet Take 1 tablet by mouth at bedtime as needed. 08/21/16   Tarry KosNaiping M Xu, MD    Family History No family history on file.  Social History Social History  Substance Use Topics  . Smoking status: Never Smoker  . Smokeless tobacco: Current User    Types: Chew      Comment: .75 of a can in a day  . Alcohol use Yes     Comment: rarely     Allergies   Ibuprofen and Ketorolac tromethamine   Review of Systems Review of Systems  Constitutional: Negative for chills and fever.  Respiratory: Positive for shortness of breath. Negative for cough and wheezing.   Cardiovascular: Positive for chest pain.  Gastrointestinal: Negative for abdominal pain, nausea and vomiting.  All other systems reviewed and are negative.    Physical Exam Updated Vital Signs BP 123/76 (BP Location: Left Arm)   Pulse 106   Temp 97.9 F (36.6 C) (Oral)   Resp 18   Ht 5\' 10"  (1.778 m)   Wt 72.6 kg   SpO2 99%   BMI 22.96 kg/m  Physical Exam  Constitutional: He is oriented to person, place, and time. He appears well-developed and well-nourished. He appears distressed.  Drowsy but also uncomfortable appearing  HENT:  Head: Normocephalic and atraumatic.  Eyes: Conjunctivae are normal. Pupils are equal, round, and reactive to light. Right eye exhibits no discharge. Left eye exhibits no discharge. No scleral icterus.  Neck: Normal range of motion. Neck supple.  Cardiovascular: Regular rhythm.  Tachycardia present.  Exam reveals no gallop and no friction rub.   No murmur heard. Pulmonary/Chest: Effort normal and breath sounds normal. No respiratory distress. He has no wheezes. He has no rales.  Abdominal: Soft. He exhibits no distension. There is no tenderness.  Musculoskeletal: He exhibits no edema.  Neurological: He is alert and oriented to person, place, and time.  Skin: Skin is warm and dry.  Psychiatric: His speech is normal. His affect is labile. He expresses impulsivity.  Nursing note and vitals reviewed.    ED Treatments / Results  Labs (all labs ordered are listed, but only abnormal results are displayed) Labs Reviewed  BASIC METABOLIC PANEL - Abnormal; Notable for the following:       Result Value   Potassium 3.4 (*)    CO2 19 (*)    Creatinine, Ser  0.59 (*)    Calcium 8.8 (*)    All other components within normal limits  CBC - Abnormal; Notable for the following:    WBC 13.1 (*)    RBC 3.67 (*)    Hemoglobin 10.4 (*)    HCT 31.8 (*)    Platelets 562 (*)    All other components within normal limits  URINE RAPID DRUG SCREEN, HOSP PERFORMED - Abnormal; Notable for the following:    Cocaine POSITIVE (*)    Amphetamines POSITIVE (*)    All other components within normal limits  I-STAT TROPOININ, ED    EKG  EKG Interpretation None       Radiology Dg Chest 2 View  Result Date: 08/26/2016 CLINICAL DATA:  Increasing shortness of breath and chest pain beginning this morning. History of surgery to the right clavicle and right arm. EXAM: CHEST  2 VIEW COMPARISON:  08/21/2016 FINDINGS: Normal heart size and pulmonary vascularity. No focal airspace disease or consolidation in the lungs. No blunting of costophrenic angles. No pneumothorax. Mediastinal contours appear intact. Postoperative change with plate and screw fixation of the right clavicle. Skin clips and soft tissue swelling over the right clavicle likely is postoperative. Radiodense material demonstrated adjacent to the skin clips probably representing postoperative change or surface contamination. No change since previous study. IMPRESSION: No active cardiopulmonary disease. Electronically Signed   By: Burman Nieves M.D.   On: 08/26/2016 22:26   Ct Angio Chest Pe W/cm &/or Wo Cm  Result Date: 08/26/2016 CLINICAL DATA:  Anterior chest pain all day today. Recent right shoulder and right elbow surgery. EXAM: CT ANGIOGRAPHY CHEST WITH CONTRAST TECHNIQUE: Multidetector CT imaging of the chest was performed using the standard protocol during bolus administration of intravenous contrast. Multiplanar CT image reconstructions and MIPs were obtained to evaluate the vascular anatomy. CONTRAST:  100 mL Isovue 370 COMPARISON:  None. FINDINGS: Technically adequate study with good opacification  of the central and segmental pulmonary arteries. No focal filling defects demonstrated. No evidence of significant pulmonary embolus. Normal heart size. Normal caliber thoracic aorta. No aortic dissection. Great vessel origins are patent. No significant lymphadenopathy in the chest. Esophagus is decompressed. Patchy areas of focal airspace disease demonstrated throughout the  right lung, most prominent in the periphery and right middle lung. This may represent multifocal pneumonia. Left lung is generally clear. Scattered emphysematous changes in the lungs. Diffuse airways thickening may represent chronic or acute bronchitic changes. Airways are patent. No pneumothorax. No pleural effusions. Included portions of the upper abdominal organs are grossly unremarkable. Small esophageal hiatal hernia. Postoperative changes with plate and screw fixation of the right clavicle. Multiple displaced bone fragments are demonstrated. Soft tissue prominence around the area of the fracture with increased density material present. This may represent postoperative fluid collection with calcification or hemorrhagic products. Review of the MIP images confirms the above findings. IMPRESSION: No evidence of significant pulmonary embolus. Patchy areas of airspace disease throughout the right lung may represent. Emphysematous and chronic bronchitic changes in the lungs. Postoperative changes in the right clavicle. Probable postoperative fluid collection versus hematoma. Increased density material may be postoperative. Electronically Signed   By: Burman Nieves M.D.   On: 08/26/2016 22:56    Procedures Procedures (including critical care time)  Medications Ordered in ED Medications  sodium chloride 0.9 % bolus 1,000 mL (1,000 mLs Intravenous New Bag/Given 08/26/16 2221)  fentaNYL (SUBLIMAZE) injection 50 mcg (50 mcg Intravenous Given 08/26/16 2221)  iopamidol (ISOVUE-370) 76 % injection (100 mLs  Contrast Given 08/26/16 2200)    azithromycin (ZITHROMAX) tablet 500 mg (500 mg Oral Given 08/27/16 0023)     Initial Impression / Assessment and Plan / ED Course  I have reviewed the triage vital signs and the nursing notes.  Pertinent labs & imaging results that were available during my care of the patient were reviewed by me and considered in my medical decision making (see chart for details).  Clinical Course   A 45 year old male presents with pneumonia. Patient is afebrile, not tachypneic, normotensive, and not hypoxic. He is tachycardic. Chest pain work up is reassuring. EKG is sinus tachycardia and shows no significant change since last. Troponin is 0. CXR is negative however CT shows patchy areas of focal airspace disease demonstrated throughout the right lung, most prominent in the periphery and right middle lung. No PE. CBC remarkable for leukocytosis of 13.1 and mild anemia. BMP remarkable for mild hypokalemia, low CO2. UDS remarkable for cocaine and amphetamines. Of note there are no opiates in urine. IVF and fentanyl given for pain.  On recheck, patient appears comfortable. Advised cessation of cocaine. Will give dose of Azithromycin here and rx for the next 4 days. Pending delta troponin. Will hand off patient to [facility] [employer] [facility]. He can be d/c'ed for outpatient trial of antibiotics. Strict return precautions given.   Final Clinical Impressions(s) / ED Diagnoses   Final diagnoses:  Community acquired pneumonia  Chest pain, unspecified chest pain type    New Prescriptions New Prescriptions   AZITHROMYCIN (ZITHROMAX) 250 MG TABLET    Take 1 tablet (250 mg total) by mouth daily.     Bethel Born, PA-C 08/27/16 1610    Marily Memos, MD 08/29/16 [phone]

## 2016-08-26 NOTE — ED Notes (Signed)
Pt c/o "neck muscle" pain.

## 2016-08-26 NOTE — ED Triage Notes (Signed)
Per EMS, pt from home with increased shortness of breath and chest pain beginning this morning. Hx of recent right clavicle and right arm surgery, RR-24, pain in the central to right pain. BP-110/70, SpO2-96% ra, HR-110

## 2016-08-27 LAB — I-STAT TROPONIN, ED: Troponin i, poc: 0 ng/mL (ref 0.00–0.08)

## 2016-08-27 MED ORDER — FENTANYL CITRATE (PF) 100 MCG/2ML IJ SOLN
50.0000 ug | Freq: Once | INTRAMUSCULAR | Status: AC
  Administered 2016-08-27: 50 ug via INTRAVENOUS
  Filled 2016-08-27: qty 2

## 2016-08-27 MED ORDER — AZITHROMYCIN 250 MG PO TABS
500.0000 mg | ORAL_TABLET | Freq: Once | ORAL | Status: AC
  Administered 2016-08-27: 500 mg via ORAL
  Filled 2016-08-27: qty 2

## 2016-08-27 MED ORDER — AZITHROMYCIN 250 MG PO TABS: 250.0000 mg | ORAL_TABLET | Freq: Every day | ORAL | 0 refills | Status: DC

## 2016-08-27 NOTE — ED Notes (Signed)
Pt refusing to wear cardiac monitor at this time.

## 2016-08-27 NOTE — ED Provider Notes (Signed)
2:32 AM Patient care assumed from Terance HartKelly Gekas, PA-C at shift change. Plan discussed which includes discharge if delta troponin negative. Repeat troponin reviewed; 0.00 x 2. Plan to discharge with antibiotics for PNA. Suspect chest pain to be secondary to infectious etiology. He is afebrile and without hypoxia. Return precautions provided. Patient discharged in satisfactory condition.   Antony MaduraKelly Dottie Vaquerano, PA-C 08/27/16 0234    Gilda Creasehristopher J Pollina, MD 08/27/16 [phone]

## 2016-09-30 ENCOUNTER — Ambulatory Visit (INDEPENDENT_AMBULATORY_CARE_PROVIDER_SITE_OTHER): Payer: Self-pay | Admitting: Orthopaedic Surgery

## 2016-10-10 ENCOUNTER — Ambulatory Visit (INDEPENDENT_AMBULATORY_CARE_PROVIDER_SITE_OTHER): Payer: Self-pay | Admitting: Orthopaedic Surgery

## 2016-10-10 DIAGNOSIS — S52021D Displaced fracture of olecranon process without intraarticular extension of right ulna, subsequent encounter for closed fracture with routine healing: Secondary | ICD-10-CM

## 2016-10-10 DIAGNOSIS — S42021D Displaced fracture of shaft of right clavicle, subsequent encounter for fracture with routine healing: Secondary | ICD-10-CM

## 2016-11-14 ENCOUNTER — Ambulatory Visit (INDEPENDENT_AMBULATORY_CARE_PROVIDER_SITE_OTHER): Payer: No Typology Code available for payment source | Admitting: Orthopaedic Surgery

## 2017-01-31 ENCOUNTER — Encounter (HOSPITAL_BASED_OUTPATIENT_CLINIC_OR_DEPARTMENT_OTHER): Payer: Self-pay | Admitting: Emergency Medicine

## 2017-01-31 ENCOUNTER — Emergency Department (HOSPITAL_BASED_OUTPATIENT_CLINIC_OR_DEPARTMENT_OTHER): Payer: Self-pay

## 2017-01-31 ENCOUNTER — Emergency Department (HOSPITAL_BASED_OUTPATIENT_CLINIC_OR_DEPARTMENT_OTHER)
Admission: EM | Admit: 2017-01-31 | Discharge: 2017-01-31 | Disposition: A | Discharge status: Home / Self Care | Payer: Self-pay | Attending: Emergency Medicine | Admitting: Emergency Medicine

## 2017-01-31 DIAGNOSIS — J45909 Unspecified asthma, uncomplicated: Secondary | ICD-10-CM | POA: Insufficient documentation

## 2017-01-31 DIAGNOSIS — J189 Pneumonia, unspecified organism: Secondary | ICD-10-CM

## 2017-01-31 DIAGNOSIS — D649 Anemia, unspecified: Secondary | ICD-10-CM | POA: Insufficient documentation

## 2017-01-31 DIAGNOSIS — J181 Lobar pneumonia, unspecified organism: Secondary | ICD-10-CM | POA: Insufficient documentation

## 2017-01-31 DIAGNOSIS — F17290 Nicotine dependence, other tobacco product, uncomplicated: Secondary | ICD-10-CM | POA: Insufficient documentation

## 2017-01-31 DIAGNOSIS — Z79899 Other long term (current) drug therapy: Secondary | ICD-10-CM | POA: Insufficient documentation

## 2017-01-31 LAB — CBC WITH DIFFERENTIAL/PLATELET
Basophils Absolute: 0 10*3/uL (ref 0.0–0.1)
Basophils Relative: 0 %
EOS ABS: 0.1 10*3/uL (ref 0.0–0.7)
Eosinophils Relative: 1 %
HEMATOCRIT: 32.7 % — AB (ref 39.0–52.0)
HEMOGLOBIN: 10.5 g/dL — AB (ref 13.0–17.0)
LYMPHS ABS: 2.2 10*3/uL (ref 0.7–4.0)
Lymphocytes Relative: 18 %
MCH: 30.5 pg (ref 26.0–34.0)
MCHC: 32.1 g/dL (ref 30.0–36.0)
MCV: 95.1 fL (ref 78.0–100.0)
MONOS PCT: 8 %
Monocytes Absolute: 1 10*3/uL (ref 0.1–1.0)
NEUTROS ABS: 9 10*3/uL — AB (ref 1.7–7.7)
NEUTROS PCT: 73 %
Platelets: 255 10*3/uL (ref 150–400)
RBC: 3.44 MIL/uL — ABNORMAL LOW (ref 4.22–5.81)
RDW: 14.4 % (ref 11.5–15.5)
WBC: 12.3 10*3/uL — ABNORMAL HIGH (ref 4.0–10.5)

## 2017-01-31 LAB — BASIC METABOLIC PANEL
Anion gap: 5 (ref 5–15)
BUN: 7 mg/dL (ref 6–20)
CHLORIDE: 104 mmol/L (ref 101–111)
CO2: 26 mmol/L (ref 22–32)
CREATININE: 0.64 mg/dL (ref 0.61–1.24)
Calcium: 8.3 mg/dL — ABNORMAL LOW (ref 8.9–10.3)
GFR calc Af Amer: >60 mL/min (ref >60)
GFR calc non Af Amer: >60 mL/min (ref >60)
Glucose, Bld: 119 mg/dL — ABNORMAL HIGH (ref 65–99)
Potassium: 3.5 mmol/L (ref 3.5–5.1)
Sodium: 135 mmol/L (ref 135–145)

## 2017-01-31 MED ORDER — AZITHROMYCIN 250 MG PO TABS
250.0000 mg | ORAL_TABLET | Freq: Every day | ORAL | 0 refills | Status: DC
Start: 2017-01-31 — End: 2018-05-02

## 2017-01-31 MED ORDER — ALBUTEROL SULFATE (2.5 MG/3ML) 0.083% IN NEBU
5.0000 mg | INHALATION_SOLUTION | Freq: Once | RESPIRATORY_TRACT | Status: AC
Start: 2017-01-31 — End: 2017-01-31
  Administered 2017-01-31: 5 mg via RESPIRATORY_TRACT
  Filled 2017-01-31: qty 6

## 2017-01-31 MED ORDER — BENZONATATE 100 MG PO CAPS: 100.0000 mg | ORAL_CAPSULE | Freq: Three times a day (TID) | ORAL | 0 refills | Status: DC

## 2017-01-31 MED ORDER — ALBUTEROL SULFATE HFA 108 (90 BASE) MCG/ACT IN AERS: 1.0000 | INHALATION_SPRAY | Freq: Four times a day (QID) | RESPIRATORY_TRACT | 0 refills | Status: AC | PRN

## 2017-01-31 MED FILL — AZITHROMYCIN 250 MG TABLET: 250 | 5 days supply | Qty: 6 | Fill #0

## 2017-01-31 NOTE — Discharge Instructions (Signed)
Take the prescribed medication as directed.  Your blood work did show anemia which you will need re-checked with your primary care doctor so follow-up with them soon. Return to the ED for new or worsening symptoms.

## 2017-01-31 NOTE — ED Provider Notes (Signed)
[facility] Provider Note   CSN: [id number] Arrival date & time: 01/31/17  1122     History   Chief Complaint Chief Complaint  Patient presents with  . URI    HPI Stana BuntingDanny C Riggins is a 46 y.o. male.  The history is provided by the patient and medical records.  URI   Associated symptoms include congestion, rhinorrhea, sore throat and cough.    46 year old male with history of anemia, anxiety, arthritis, asthma, GERD, chronic back pain, history of subdural hematoma, presenting to the ED for cough, nasal congestion, sore throat, fever, and fatigue. Patient reports his been ongoing for about 3 days now. His girlfriend is sick with similar symptoms. States he is been coughing heavily and now has some rib pain. He has no other chest pain or shortness of breath. He has no known cardiac history. He does not smoke but does chew tobacco. Patient is also concerned about his energy level. States he went to donate blood earlier this week and was told his hemoglobin and iron were too low so he was denied. States he was told his hemoglobin was 7.4. States he did see his doctor last month about this, however orders for blood draw were never placed.  Past Medical History:  Diagnosis Date  . Anemia    hx of-  . Anxiety   . Arthritis   . Asthma   . Chronic back pain   . GERD (gastroesophageal reflux disease)   . History of blood transfusion   . History of GI bleed   . History of kidney stones   . Pneumonia    last time 2015  . Pneumothorax 2000  . Psoriasis    "in the family"  . Subdural hematoma [facility](HCC)     Patient Active Problem List   Diagnosis Date Noted  . S/P ORIF (open reduction internal fixation) fracture 08/21/2016  . OD (overdose of drug) 07/21/2015  . Microcytic hypochromic anemia 07/21/2015  . Bipolar affective (HCC) 07/21/2015  . Abnormal EKG 07/21/2015  . 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 Cocaine abuse with cocaine-induced mood  disorder (HCC) 07/21/2015  . DEPRESSION 06/22/2009  . OSTEOARTHRITIS 08/23/2008  . WEIGHT LOSS 08/23/2008  . NEPHROLITHIASIS, HX OF 08/23/2008  . ASTHMA 08/12/2008  . GERD 08/12/2008  . KNEE PAIN, RIGHT 08/12/2008  . Other postprocedural status(V45.89) 08/12/2008  . 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 TIBIAL FRACTURE 12/30/1998    Past Surgical History:  Procedure Laterality Date  . CARPAL TUNNEL RELEASE Right   . COLONOSCOPY WITH ESOPHAGOGASTRODUODENOSCOPY (EGD)    . JOINT REPLACEMENT Right    knee  . LEG SURGERY     left arthroscopy with screws   . ORIF CLAVICULAR FRACTURE Right 08/07/2016   Procedure: OPEN REDUCTION INTERNAL FIXATION (ORIF) RIGHT CLAVICLE FRACTURE;  Surgeon: Tarry KosNaiping M Xu, MD;  Location: [facility] OR;  Service: Orthopedics;  Laterality: Right;  . ORIF CLAVICULAR FRACTURE Right 08/21/2016   Procedure: REVISION OPEN REDUCTION INTERNAL FIXATION (ORIF) RIGHT CLAVICLE FRACTURE AND OPEN REDUCTION INTERNAL FIXATION RIGHT OLECRANON;  Surgeon: Tarry KosNaiping M Xu, MD;  Location: [facility] OR;  Service: Orthopedics;  Laterality: Right;  . ORIF ELBOW FRACTURE Right 08/21/2016   Procedure: OPEN REDUCTION INTERNAL FIXATION (ORIF) ELBOW/OLECRANON FRACTURE;  Surgeon: Tarry KosNaiping M Xu, MD;  Location: [facility] OR;  Service: Orthopedics;  Laterality: Right;       Home Medications    Prior to Admission medications   Medication Sig Start Date End Date  Taking? Authorizing Provider  albuterol (PROVENTIL HFA) 108 (90 Base) MCG/ACT inhaler Inhale 2 puffs into the lungs every 6 (six) hours as needed for wheezing or shortness of breath.   Yes Historical Provider, MD  beclomethasone (QVAR) 40 MCG/ACT inhaler Inhale 2 puffs into the lungs 2 (two) times daily as needed (for symptoms).   Yes Historical Provider, MD  gabapentin (NEURONTIN) 300 MG capsule Take 900 mg by mouth 4 (four) times daily.   Yes Historical Provider, MD  acetaminophen (TYLENOL) 500 MG tablet Take 1,000 mg by mouth every 6 (six) hours as  needed for moderate pain.    Historical Provider, MD  megestrol (MEGACE) 40 MG/ML suspension Take 400 mg by mouth daily. 07/31/16 07/31/17  Historical Provider, MD  methocarbamol (ROBAXIN) 750 MG tablet Take 1 tablet (750 mg total) by mouth 2 (two) times daily as needed for muscle spasms. 08/21/16   Tarry Kos, MD  morphine (MSIR) 15 MG tablet Take 1 tablet (15 mg total) by mouth every 4 (four) hours as needed for severe pain. Patient not taking: Reported on 08/27/2016 08/01/16   Melene Plan, DO  omeprazole (PRILOSEC) 20 MG capsule Take 20 mg by mouth 2 (two) times daily before a meal.    Historical Provider, MD  oxyCODONE (OXYCONTIN) 10 mg 12 hr tablet Take 1 tablet (10 mg total) by mouth every 12 (twelve) hours. Patient not taking: Reported on 08/27/2016 08/07/16   Tarry Kos, MD    Family History No family history on file.  Social History Social History  Substance Use Topics  . Smoking status: Never Smoker  . Smokeless tobacco: Current User    Types: Chew     Comment: .75 of a can in a day  . Alcohol use Yes     Comment: rarely     Allergies   Ibuprofen and Ketorolac tromethamine   Review of Systems Review of Systems  Constitutional: Positive for fatigue and fever.  HENT: Positive for congestion, postnasal drip, rhinorrhea and sore throat.   Respiratory: Positive for cough.   All other systems reviewed and are negative.    Physical Exam Updated Vital Signs SpO2 98%   Physical Exam  Constitutional: He is oriented to person, place, and time. He appears well-developed and well-nourished.  HENT:  Head: Normocephalic and atraumatic.  Mouth/Throat: Oropharynx is clear and moist.  Voice is raspy, no stridor, handling secretions well, no facial or neck swelling  Eyes: Conjunctivae and EOM are normal. Pupils are equal, round, and reactive to light.  Neck: Normal range of motion.  Cardiovascular: Normal rate, regular rhythm and normal heart sounds.   Pulmonary/Chest: Effort normal.  No respiratory distress. He has wheezes.  Coarse breath sounds with intermixed wheezes, mildly worse on the right, no distress, no retractions  Abdominal: Soft. Bowel sounds are normal.  Musculoskeletal: Normal range of motion.  Neurological: He is alert and oriented to person, place, and time.  Skin: Skin is warm and dry.  Psychiatric: He has a normal mood and affect.  Nursing note and vitals reviewed.    ED Treatments / Results  Labs (all labs ordered are listed, but only abnormal results are displayed) Labs Reviewed  CBC WITH DIFFERENTIAL/PLATELET - Abnormal; Notable for the following:       Result Value   WBC 12.3 (*)    RBC 3.44 (*)    Hemoglobin 10.5 (*)    HCT 32.7 (*)    Neutro Abs 9.0 (*)    All other components  within normal limits  BASIC METABOLIC PANEL - Abnormal; Notable for the following:    Glucose, Bld 119 (*)    Calcium 8.3 (*)    All other components within normal limits    EKG  EKG Interpretation None       Radiology Dg Chest 2 View  Result Date: 01/31/2017 CLINICAL DATA:  Cough and congestion EXAM: CHEST  2 VIEW COMPARISON:  08/26/2016 FINDINGS: Cardiac shadow is within normal limits. The lungs are well aerated bilaterally. Mild central vascular congestion is noted without significant edema. Some patchy density is noted in the right upper lobe just above the minor fissure consistent with early infiltrate. Prior right clavicular fixation is noted. IMPRESSION: Mild vascular prominence centrally with mild right upper lobe infiltrate. Electronically Signed   By: Alcide Clever M.D.   On: 01/31/2017 13:31    Procedures Procedures (including critical care time)  Medications Ordered in ED Medications  albuterol (PROVENTIL) (2.5 MG/3ML) 0.083% nebulizer solution 5 mg (5 mg Nebulization Given 01/31/17 1242)     Initial Impression / Assessment and Plan / ED Course  I have reviewed the triage vital signs and the nursing notes.  Pertinent labs & imaging  results that were available during my care of the patient were reviewed by me and considered in my medical decision making (see chart for details).  46 year old male here with cough, fever, and other upper respiratory symptoms. Girlfriend sick with similar symptoms. He is afebrile and nontoxic in appearance here. He does have coarse breath sounds with intermixed wheezes, worse on the right. His vital signs are stable, no respiratory distress. Patient does inform me that he attempted to get blood earlier this week and he was told his hemoglobin was low at 7.3. Given this, will obtain basic labs as well as chest x-ray. Patient was given nebulizer treatment here.  Patient's blood work with noted anemia, however stable with hemoglobin of 10.5. Mild leukocytosis noted. Electrolytes are overall normal. Chest x-ray does confirm a right upper lobe pneumonia which is likely the source of his cough and fever. After treatment here with nebulizer, patient feeling better. We'll plan to discharge home with azithromycin, Tessalon, and refill of albuterol inhaler. He was encouraged to follow-up with his primary care doctor to ensure pneumonia resolves as well as to recheck his anemia.  Final Clinical Impressions(s) / ED Diagnoses   Final diagnoses:  Community acquired pneumonia of right upper lobe of lung (HCC)  Anemia, unspecified type    New Prescriptions Discharge Medication List as of 01/31/2017  2:24 PM    START taking these medications   Details  azithromycin (ZITHROMAX) 250 MG tablet Take 1 tablet (250 mg total) by mouth daily. Take first 2 tablets together, then 1 every day until finished., Starting Fri 01/31/2017, Print    benzonatate (TESSALON) 100 MG capsule Take 1 capsule (100 mg total) by mouth every 8 (eight) hours., Starting Fri 01/31/2017, Print         Garlon Hatchet, PA-C 01/31/17 1432    Vanetta Mulders, MD 02/06/17 [phone]

## 2017-01-31 NOTE — ED Triage Notes (Signed)
Pt having headache, fever, body aches, cough, runny nose, yellow sputum.

## 2017-12-29 ENCOUNTER — Telehealth (INDEPENDENT_AMBULATORY_CARE_PROVIDER_SITE_OTHER): Payer: Self-pay | Admitting: *Deleted

## 2017-12-29 NOTE — Telephone Encounter (Signed)
Received Triage call left on vm stating pt has hurt knee and [facility] and noticed that he has a fx of tibia closed fx and wanted to know if pt could be seen ASAP (today) d/t insurance I called pt  Back no anwere left message to return call. Dr. Magnus IvanBlackman has opening at 115p today, pending call back from pt to try to get pt in at 115p

## 2018-04-09 ENCOUNTER — Other Ambulatory Visit: Payer: Self-pay

## 2018-04-09 ENCOUNTER — Emergency Department (HOSPITAL_BASED_OUTPATIENT_CLINIC_OR_DEPARTMENT_OTHER): Payer: Medicaid Other

## 2018-04-09 ENCOUNTER — Encounter: Payer: Self-pay | Admitting: Emergency Medicine

## 2018-04-09 ENCOUNTER — Encounter (HOSPITAL_BASED_OUTPATIENT_CLINIC_OR_DEPARTMENT_OTHER): Payer: Self-pay | Admitting: *Deleted

## 2018-04-09 ENCOUNTER — Inpatient Hospital Stay (HOSPITAL_BASED_OUTPATIENT_CLINIC_OR_DEPARTMENT_OTHER)
Admission: EM | Admit: 2018-04-09 | Discharge: 2018-04-14 | DRG: 917 | Discharge status: Against Medical Advice | Payer: Medicaid Other | Attending: Internal Medicine | Admitting: Internal Medicine

## 2018-04-09 DIAGNOSIS — G92 Toxic encephalopathy: Secondary | ICD-10-CM | POA: Diagnosis present

## 2018-04-09 DIAGNOSIS — M79604 Pain in right leg: Secondary | ICD-10-CM | POA: Diagnosis not present

## 2018-04-09 DIAGNOSIS — G9340 Encephalopathy, unspecified: Secondary | ICD-10-CM | POA: Diagnosis present

## 2018-04-09 DIAGNOSIS — K521 Toxic gastroenteritis and colitis: Secondary | ICD-10-CM | POA: Diagnosis not present

## 2018-04-09 DIAGNOSIS — F14129 Cocaine abuse with intoxication, unspecified: Secondary | ICD-10-CM | POA: Diagnosis present

## 2018-04-09 DIAGNOSIS — Z91048 Other nonmedicinal substance allergy status: Secondary | ICD-10-CM

## 2018-04-09 DIAGNOSIS — S065XAA Traumatic subdural hemorrhage with loss of consciousness status unknown, initial encounter: Secondary | ICD-10-CM | POA: Insufficient documentation

## 2018-04-09 DIAGNOSIS — S065X9A Traumatic subdural hemorrhage with loss of consciousness of unspecified duration, initial encounter: Secondary | ICD-10-CM | POA: Insufficient documentation

## 2018-04-09 DIAGNOSIS — Y92008 Other place in unspecified non-institutional (private) residence as the place of occurrence of the external cause: Secondary | ICD-10-CM

## 2018-04-09 DIAGNOSIS — G8929 Other chronic pain: Secondary | ICD-10-CM | POA: Diagnosis present

## 2018-04-09 DIAGNOSIS — R569 Unspecified convulsions: Secondary | ICD-10-CM | POA: Diagnosis present

## 2018-04-09 DIAGNOSIS — G931 Anoxic brain damage, not elsewhere classified: Secondary | ICD-10-CM

## 2018-04-09 DIAGNOSIS — J45909 Unspecified asthma, uncomplicated: Secondary | ICD-10-CM | POA: Diagnosis present

## 2018-04-09 DIAGNOSIS — F1910 Other psychoactive substance abuse, uncomplicated: Secondary | ICD-10-CM

## 2018-04-09 DIAGNOSIS — E876 Hypokalemia: Secondary | ICD-10-CM | POA: Diagnosis not present

## 2018-04-09 DIAGNOSIS — T3695XA Adverse effect of unspecified systemic antibiotic, initial encounter: Secondary | ICD-10-CM | POA: Diagnosis not present

## 2018-04-09 DIAGNOSIS — T401X1A Poisoning by heroin, accidental (unintentional), initial encounter: Principal | ICD-10-CM | POA: Diagnosis present

## 2018-04-09 DIAGNOSIS — R4701 Aphasia: Secondary | ICD-10-CM | POA: Diagnosis present

## 2018-04-09 DIAGNOSIS — J690 Pneumonitis due to inhalation of food and vomit: Secondary | ICD-10-CM | POA: Diagnosis present

## 2018-04-09 DIAGNOSIS — I493 Ventricular premature depolarization: Secondary | ICD-10-CM | POA: Diagnosis present

## 2018-04-09 DIAGNOSIS — T50901A Poisoning by unspecified drugs, medicaments and biological substances, accidental (unintentional), initial encounter: Secondary | ICD-10-CM

## 2018-04-09 DIAGNOSIS — F10239 Alcohol dependence with withdrawal, unspecified: Secondary | ICD-10-CM | POA: Diagnosis not present

## 2018-04-09 DIAGNOSIS — G9341 Metabolic encephalopathy: Secondary | ICD-10-CM | POA: Diagnosis present

## 2018-04-09 DIAGNOSIS — R918 Other nonspecific abnormal finding of lung field: Secondary | ICD-10-CM

## 2018-04-09 DIAGNOSIS — F17210 Nicotine dependence, cigarettes, uncomplicated: Secondary | ICD-10-CM | POA: Diagnosis present

## 2018-04-09 DIAGNOSIS — F329 Major depressive disorder, single episode, unspecified: Secondary | ICD-10-CM | POA: Diagnosis present

## 2018-04-09 DIAGNOSIS — M549 Dorsalgia, unspecified: Secondary | ICD-10-CM | POA: Diagnosis present

## 2018-04-09 DIAGNOSIS — M79671 Pain in right foot: Secondary | ICD-10-CM | POA: Diagnosis not present

## 2018-04-09 DIAGNOSIS — T405X1A Poisoning by cocaine, accidental (unintentional), initial encounter: Secondary | ICD-10-CM | POA: Diagnosis present

## 2018-04-09 DIAGNOSIS — Z23 Encounter for immunization: Secondary | ICD-10-CM

## 2018-04-09 DIAGNOSIS — Z5321 Procedure and treatment not carried out due to patient leaving prior to being seen by health care provider: Secondary | ICD-10-CM | POA: Diagnosis not present

## 2018-04-09 DIAGNOSIS — D649 Anemia, unspecified: Secondary | ICD-10-CM

## 2018-04-09 DIAGNOSIS — Z87820 Personal history of traumatic brain injury: Secondary | ICD-10-CM

## 2018-04-09 DIAGNOSIS — I10 Essential (primary) hypertension: Secondary | ICD-10-CM | POA: Diagnosis present

## 2018-04-09 DIAGNOSIS — F11129 Opioid abuse with intoxication, unspecified: Secondary | ICD-10-CM | POA: Diagnosis present

## 2018-04-09 DIAGNOSIS — Z888 Allergy status to other drugs, medicaments and biological substances status: Secondary | ICD-10-CM

## 2018-04-09 HISTORY — DX: Calculus of kidney: N20.0

## 2018-04-09 HISTORY — DX: Opioid abuse, uncomplicated: F11.10

## 2018-04-09 HISTORY — DX: Cocaine abuse, uncomplicated: F14.10

## 2018-04-09 HISTORY — DX: Gastrointestinal hemorrhage, unspecified: K92.2

## 2018-04-09 LAB — BASIC METABOLIC PANEL
Anion gap: 9 (ref 5–15)
BUN: 6 mg/dL (ref 6–20)
CHLORIDE: 104 mmol/L (ref 101–111)
CO2: 21 mmol/L — ABNORMAL LOW (ref 22–32)
CREATININE: 0.75 mg/dL (ref 0.61–1.24)
Calcium: 8.6 mg/dL — ABNORMAL LOW (ref 8.9–10.3)
GFR calc non Af Amer: >60 mL/min (ref >60)
Glucose, Bld: 90 mg/dL (ref 65–99)
Potassium: 3.9 mmol/L (ref 3.5–5.1)
SODIUM: 134 mmol/L — AB (ref 135–145)

## 2018-04-09 LAB — CBC WITH DIFFERENTIAL/PLATELET
BASOS PCT: 1 %
Basophils Absolute: 0.1 10*3/uL (ref 0.0–0.1)
EOS ABS: 0.1 10*3/uL (ref 0.0–0.7)
Eosinophils Relative: 1 %
HCT: 26.9 % — ABNORMAL LOW (ref 39.0–52.0)
Hemoglobin: 9.1 g/dL — ABNORMAL LOW (ref 13.0–17.0)
Lymphocytes Relative: 20 %
Lymphs Abs: 2.4 10*3/uL (ref 0.7–4.0)
MCH: 27.7 pg (ref 26.0–34.0)
MCHC: 33.8 g/dL (ref 30.0–36.0)
MCV: 82 fL (ref 78.0–100.0)
Monocytes Absolute: 1.2 10*3/uL — ABNORMAL HIGH (ref 0.1–1.0)
Monocytes Relative: 10 %
NEUTROS PCT: 68 %
Neutro Abs: 8.3 10*3/uL — ABNORMAL HIGH (ref 1.7–7.7)
Platelets: 626 10*3/uL — ABNORMAL HIGH (ref 150–400)
RBC: 3.28 MIL/uL — AB (ref 4.22–5.81)
RDW: 15.5 % (ref 11.5–15.5)
WBC: 12 10*3/uL — AB (ref 4.0–10.5)

## 2018-04-09 LAB — ETHANOL: Alcohol, Ethyl (B): <10 mg/dL (ref <10)

## 2018-04-09 LAB — RAPID URINE DRUG SCREEN, HOSP PERFORMED
Amphetamines: NOT DETECTED
Barbiturates: NOT DETECTED
Benzodiazepines: NOT DETECTED
COCAINE: POSITIVE — AB
OPIATES: POSITIVE — AB
TETRAHYDROCANNABINOL: NOT DETECTED

## 2018-04-09 LAB — SALICYLATE LEVEL: Salicylate Lvl: <7 mg/dL (ref 2.8–30.0)

## 2018-04-09 LAB — ACETAMINOPHEN LEVEL

## 2018-04-09 LAB — CBG MONITORING, ED: GLUCOSE-CAPILLARY: 88 mg/dL (ref 65–99)

## 2018-04-09 MED ORDER — NALOXONE HCL 0.4 MG/ML IJ SOLN
0.4000 mg | Freq: Once | INTRAMUSCULAR | Status: AC
  Administered 2018-04-09: 0.4 mg via INTRAVENOUS
  Filled 2018-04-09: qty 1

## 2018-04-09 MED ORDER — NALOXONE HCL 0.4 MG/ML IJ SOLN
0.4000 mg | Freq: Once | INTRAMUSCULAR | Status: AC
  Administered 2018-04-09: 0.4 mg via INTRAVENOUS

## 2018-04-09 MED ORDER — NALOXONE HCL 0.4 MG/ML IJ SOLN
INTRAMUSCULAR | Status: AC
  Filled 2018-04-09: qty 2

## 2018-04-09 NOTE — ED Triage Notes (Signed)
Pt brought in by EMS from home , found unresponsive on cough, narcan IM given PTA by Wife.

## 2018-04-09 NOTE — ED Notes (Signed)
Pt to CT scan while on monitor with this RN to accompany.

## 2018-04-09 NOTE — ED Notes (Signed)
ED Provider at bedside. 

## 2018-04-09 NOTE — ED Provider Notes (Addendum)
[facility] Provider Note: Samuel Barrett Kyannah Climer, MD, FACEP  CSN: [id number] MRN: [id number] ARRIVAL: 04/09/18 at 2215 ROOM: [facility]05/[facility]05   CHIEF COMPLAINT  Drug Overdose  Level 5 caveat: Altered mental status HISTORY OF PRESENT ILLNESS  04/09/18 10:42 PM Samuel FifeDanny Barrett is a 47 y.o. male with a history of cocaine and heroin abuse.  EMS was called by his wife when he was found unresponsive. He had a grey powder lying on a nearby mirror, which he had apparently been snorting.  He was given a total of 1.2 mg of Narcan prior to arrival without significant change in mental status.  There was no obvious trauma seen.  He will open his eyes and withdraw to noxious stimuli but is nonverbal.   Past Medical History:  Diagnosis Date  . Cocaine abuse (HCC)     History reviewed. No pertinent surgical history.  History reviewed. No pertinent family history.  Social History   Tobacco Use  . Smoking status: Current Every Day Smoker    Types: Cigarettes  . Smokeless tobacco: Never Used  Substance Use Topics  . Alcohol use: Not on file  . Drug use: Yes    Types: Cocaine    Comment: heroin    Prior to Admission medications   Not on File    Allergies Other   REVIEW OF SYSTEMS  Negative except as noted here or in the History of Present Illness.   PHYSICAL EXAMINATION  Initial Vital Signs Blood pressure 115/82, pulse (!) 113, temperature 98.4 F (36.9 C), temperature source Oral, resp. rate 18, height 5\' 6"  (1.676 m), weight 90.7 kg (200 lb), SpO2 99 %.  Examination General: Well-developed, well-nourished male in no acute distress; appearance consistent with age of record HENT: normocephalic; atraumatic Eyes: pupils equal, round, about 6 mm, sluggish Neck: supple Heart: regular rate and rhythm; tachycardia Lungs: Faint expiratory wheezes Abdomen: soft; nondistended; bowel sounds present Extremities: No deformity; pulses normal; well-healing surgical incisions of left knee,  edema of left lower leg Neurologic: Opens eyes, withdraws to noxious stimuli, nonverbal; noted to move all extremities more prominently on the left than the right; no facial droop Skin: Warm and dry    RESULTS  Summary of this visit's results, reviewed by myself:   EKG Interpretation  Date/Time:    Ventricular Rate:    PR Interval:    QRS Duration:   QT Interval:    QTC Calculation:   R Axis:     Text Interpretation:        Laboratory Studies: Results for orders placed or performed during the hospital encounter of 04/09/18 (from the past 24 hour(s))  CBG monitoring, ED     Status: None   Collection Time: 04/09/18 10:22 PM  Result Value Ref Range   Glucose-Capillary 88 65 - 99 mg/dL  CBC with Differential/Platelet     Status: Abnormal   Collection Time: 04/09/18 10:49 PM  Result Value Ref Range   WBC 12.0 (H) 4.0 - 10.5 K/uL   RBC 3.28 (L) 4.22 - 5.81 MIL/uL   Hemoglobin 9.1 (L) 13.0 - 17.0 g/dL   HCT 46.926.9 (L) 62.939.0 - 52.852.0 %   MCV 82.0 78.0 - 100.0 fL   MCH 27.7 26.0 - 34.0 pg   MCHC 33.8 30.0 - 36.0 g/dL   RDW 41.315.5 24.411.5 - 01.015.5 %   Platelets 626 (H) 150 - 400 K/uL   Neutrophils Relative % 68 %   Neutro Abs 8.3 (H) 1.7 - 7.7 K/uL  Lymphocytes Relative 20 %   Lymphs Abs 2.4 0.7 - 4.0 K/uL   Monocytes Relative 10 %   Monocytes Absolute 1.2 (H) 0.1 - 1.0 K/uL   Eosinophils Relative 1 %   Eosinophils Absolute 0.1 0.0 - 0.7 K/uL   Basophils Relative 1 %   Basophils Absolute 0.1 0.0 - 0.1 K/uL  Basic metabolic panel     Status: Abnormal   Collection Time: 04/09/18 10:49 PM  Result Value Ref Range   Sodium 134 (L) 135 - 145 mmol/L   Potassium 3.9 3.5 - 5.1 mmol/L   Chloride 104 101 - 111 mmol/L   CO2 21 (L) 22 - 32 mmol/L   Glucose, Bld 90 65 - 99 mg/dL   BUN 6 6 - 20 mg/dL   Creatinine, Ser 1.61 0.61 - 1.24 mg/dL   Calcium 8.6 (L) 8.9 - 10.3 mg/dL   GFR calc non Af Amer >60 >60 mL/min   GFR calc Af Amer >60 >60 mL/min   Anion gap 9 5 - 15  Rapid urine drug  screen (hospital performed)     Status: Abnormal   Collection Time: 04/09/18 10:49 PM  Result Value Ref Range   Opiates POSITIVE (A) NONE DETECTED   Cocaine POSITIVE (A) NONE DETECTED   Benzodiazepines NONE DETECTED NONE DETECTED   Amphetamines NONE DETECTED NONE DETECTED   Tetrahydrocannabinol NONE DETECTED NONE DETECTED   Barbiturates NONE DETECTED NONE DETECTED  Ethanol     Status: None   Collection Time: 04/09/18 10:49 PM  Result Value Ref Range   Alcohol, Ethyl (B) <10 <10 mg/dL  Salicylate level     Status: None   Collection Time: 04/09/18 10:49 PM  Result Value Ref Range   Salicylate Lvl <7.0 2.8 - 30.0 mg/dL  Acetaminophen level     Status: Abnormal   Collection Time: 04/09/18 10:49 PM  Result Value Ref Range   Acetaminophen (Tylenol), Serum <10 (L) 10 - 30 ug/mL   Imaging Studies: Ct Head Wo Contrast  Result Date: 04/09/2018 CLINICAL DATA:  Altered level of consciousness. EXAM: CT HEAD WITHOUT CONTRAST TECHNIQUE: Contiguous axial images were obtained from the base of the skull through the vertex without intravenous contrast. COMPARISON:  Head CT 02/21/2016 FINDINGS: Brain: Unchanged general atrophy, advanced for age. Mild chronic small vessel ischemia. No intracranial hemorrhage, mass effect, or midline shift. No hydrocephalus. The basilar cisterns are patent. No evidence of territorial infarct or acute ischemia. No extra-axial or intracranial fluid collection. Vascular: No hyperdense vessel or unexpected calcification. Skull: No fracture or focal lesion. Sinuses/Orbits: Right maxillary sinus mucosal thickening, new from prior. Other: None. IMPRESSION: 1.  No acute intracranial abnormality. 2. Stable generalized atrophy, age advanced. 3. Right maxillary sinus inflammation. Electronically Signed   By: Rubye Oaks M.D.   On: 04/09/2018 23:19   Dg Chest Portable 1 View  Result Date: 04/10/2018 CLINICAL DATA:  Initial evaluation for acute cough. EXAM: PORTABLE CHEST 1 VIEW  COMPARISON:  None available. FINDINGS: Cardiac and mediastinal silhouettes are within normal limits. Lungs are hypoinflated. Hazy opacity within the left mid and lower lung, suspicious for possible infiltrates given provided history of cough. Mild perihilar vascular congestion without pulmonary edema. No pleural effusion. No pneumothorax. No acute osseus abnormality. Sequelae of prior ORIF noted at the right clavicle. IMPRESSION: Hazy opacities within the left mid and lower lung, suspicious for possible infiltrates given the provided history of cough. Electronically Signed   By: Rise Mu M.D.   On: 04/10/2018 02:41  ED COURSE  Nursing notes and initial vitals signs, including pulse oximetry, reviewed.  Vitals:   04/10/18 0100 04/10/18 0200 04/10/18 0230 04/10/18 0300  BP: (!) 141/91 107/75 100/70 100/73  Pulse: 95 99 95 95  Resp: 14 17 16 14   Temp:      TempSrc:      SpO2: 96% 98% 94% 97%  Weight:      Height:       11:01 PM Additional 0.8 mg of Narcan given without change.  11:48 PM The patient's wife states he was snorting a grey powder but does not know what was in it.  She states he has been doing this "for the pain".  He had a recent ORIF for left tibial fracture.  He was given a prescription for 40 pain pills [hydrocodone/APAP 5/325 filled 04/07/2018 per state controlled substance database] "the other day and is already out".  She last saw him normal at about 7 PM when he told her he was leaving to "go to the store".  She did not find him in his current state until several hours later and she called EMS.   3:30 AM Patient has been observed and rechecked multiple times over the course of several hours.  His mental status has not changed.  He is still awake with open eyes but nonverbal with minimal ability to interact.  I am highly suspicious of an anoxic brain injury secondary to opioid overdose.  Rocephin and Zithromax ordered for possible community-acquired pneumonia given  x-ray findings.   4:01 AM Dr. Madelyn Flavors accepts for transfer to [facility].  PROCEDURES   CRITICAL CARE Performed by: Paula Libra L Total critical care time: 45 minutes Critical care time was exclusive of separately billable procedures and treating other patients. Critical care was necessary to treat or prevent imminent or life-threatening deterioration. Critical care was time spent personally by me on the following activities: development of treatment plan with patient and/or surrogate as well as nursing, discussions with consultants, evaluation of patient's response to treatment, examination of patient, obtaining history from patient or surrogate, ordering and performing treatments and interventions, ordering and review of laboratory studies, ordering and review of radiographic studies, pulse oximetry and re-evaluation of patient's condition.   ED DIAGNOSES     ICD-10-CM   1. Anoxic brain injury (HCC) G93.1   2. Polysubstance abuse (HCC) F19.10   3. Accidental drug overdose, initial encounter T50.901A   4. Anemia, unspecified type D64.9        Chandon Lazcano, Jonny Ruiz, MD 04/10/18 0401    Paula Libra, MD 04/10/18 0402    Denise Bramblett, Jonny Ruiz, MD 04/10/18 [phone]

## 2018-04-10 ENCOUNTER — Inpatient Hospital Stay (HOSPITAL_COMMUNITY): Payer: Medicaid Other

## 2018-04-10 ENCOUNTER — Emergency Department (HOSPITAL_BASED_OUTPATIENT_CLINIC_OR_DEPARTMENT_OTHER): Payer: Medicaid Other

## 2018-04-10 ENCOUNTER — Encounter (HOSPITAL_BASED_OUTPATIENT_CLINIC_OR_DEPARTMENT_OTHER): Payer: Self-pay | Admitting: Emergency Medicine

## 2018-04-10 DIAGNOSIS — K521 Toxic gastroenteritis and colitis: Secondary | ICD-10-CM | POA: Diagnosis not present

## 2018-04-10 DIAGNOSIS — Y92008 Other place in unspecified non-institutional (private) residence as the place of occurrence of the external cause: Secondary | ICD-10-CM | POA: Diagnosis not present

## 2018-04-10 DIAGNOSIS — G931 Anoxic brain damage, not elsewhere classified: Secondary | ICD-10-CM | POA: Diagnosis not present

## 2018-04-10 DIAGNOSIS — J690 Pneumonitis due to inhalation of food and vomit: Secondary | ICD-10-CM | POA: Diagnosis present

## 2018-04-10 DIAGNOSIS — F329 Major depressive disorder, single episode, unspecified: Secondary | ICD-10-CM | POA: Diagnosis present

## 2018-04-10 DIAGNOSIS — R569 Unspecified convulsions: Secondary | ICD-10-CM | POA: Diagnosis present

## 2018-04-10 DIAGNOSIS — M79671 Pain in right foot: Secondary | ICD-10-CM | POA: Diagnosis not present

## 2018-04-10 DIAGNOSIS — G9341 Metabolic encephalopathy: Secondary | ICD-10-CM | POA: Diagnosis present

## 2018-04-10 DIAGNOSIS — E876 Hypokalemia: Secondary | ICD-10-CM | POA: Diagnosis not present

## 2018-04-10 DIAGNOSIS — T401X1A Poisoning by heroin, accidental (unintentional), initial encounter: Secondary | ICD-10-CM | POA: Diagnosis present

## 2018-04-10 DIAGNOSIS — F1910 Other psychoactive substance abuse, uncomplicated: Secondary | ICD-10-CM

## 2018-04-10 DIAGNOSIS — Z888 Allergy status to other drugs, medicaments and biological substances status: Secondary | ICD-10-CM | POA: Diagnosis not present

## 2018-04-10 DIAGNOSIS — F17210 Nicotine dependence, cigarettes, uncomplicated: Secondary | ICD-10-CM | POA: Diagnosis present

## 2018-04-10 DIAGNOSIS — Z23 Encounter for immunization: Secondary | ICD-10-CM | POA: Diagnosis not present

## 2018-04-10 DIAGNOSIS — I10 Essential (primary) hypertension: Secondary | ICD-10-CM | POA: Diagnosis present

## 2018-04-10 DIAGNOSIS — T3695XA Adverse effect of unspecified systemic antibiotic, initial encounter: Secondary | ICD-10-CM | POA: Diagnosis not present

## 2018-04-10 DIAGNOSIS — J45909 Unspecified asthma, uncomplicated: Secondary | ICD-10-CM | POA: Diagnosis present

## 2018-04-10 DIAGNOSIS — F11129 Opioid abuse with intoxication, unspecified: Secondary | ICD-10-CM | POA: Diagnosis present

## 2018-04-10 DIAGNOSIS — F10239 Alcohol dependence with withdrawal, unspecified: Secondary | ICD-10-CM | POA: Diagnosis not present

## 2018-04-10 DIAGNOSIS — T405X1A Poisoning by cocaine, accidental (unintentional), initial encounter: Secondary | ICD-10-CM | POA: Diagnosis present

## 2018-04-10 DIAGNOSIS — F14129 Cocaine abuse with intoxication, unspecified: Secondary | ICD-10-CM | POA: Diagnosis present

## 2018-04-10 DIAGNOSIS — G92 Toxic encephalopathy: Secondary | ICD-10-CM | POA: Diagnosis present

## 2018-04-10 DIAGNOSIS — I493 Ventricular premature depolarization: Secondary | ICD-10-CM | POA: Diagnosis present

## 2018-04-10 DIAGNOSIS — G8929 Other chronic pain: Secondary | ICD-10-CM | POA: Diagnosis present

## 2018-04-10 DIAGNOSIS — R4701 Aphasia: Secondary | ICD-10-CM | POA: Diagnosis present

## 2018-04-10 DIAGNOSIS — G9340 Encephalopathy, unspecified: Secondary | ICD-10-CM | POA: Diagnosis present

## 2018-04-10 DIAGNOSIS — Z87820 Personal history of traumatic brain injury: Secondary | ICD-10-CM | POA: Diagnosis not present

## 2018-04-10 DIAGNOSIS — M549 Dorsalgia, unspecified: Secondary | ICD-10-CM | POA: Diagnosis present

## 2018-04-10 DIAGNOSIS — M79604 Pain in right leg: Secondary | ICD-10-CM | POA: Diagnosis not present

## 2018-04-10 LAB — URINALYSIS, ROUTINE W REFLEX MICROSCOPIC
Bilirubin Urine: NEGATIVE
GLUCOSE, UA: NEGATIVE mg/dL
HGB URINE DIPSTICK: NEGATIVE
KETONES UR: NEGATIVE mg/dL
Leukocytes, UA: NEGATIVE
Nitrite: NEGATIVE
PROTEIN: NEGATIVE mg/dL
Specific Gravity, Urine: 1.009 (ref 1.005–1.030)
pH: 5 (ref 5.0–8.0)

## 2018-04-10 LAB — BLOOD GAS, ARTERIAL
Acid-Base Excess: 1.3 mmol/L (ref 0.0–2.0)
BICARBONATE: 26.2 mmol/L (ref 20.0–28.0)
DRAWN BY: 521601
O2 Content: 1.5 L/min
O2 SAT: 98.5 %
PATIENT TEMPERATURE: 98.6
PO2 ART: 112 mmHg — AB (ref 83.0–108.0)
pCO2 arterial: 48.6 mmHg — ABNORMAL HIGH (ref 32.0–48.0)
pH, Arterial: 7.352 (ref 7.350–7.450)

## 2018-04-10 LAB — TROPONIN I
Troponin I: <0.03 ng/mL
Troponin I: <0.03 ng/mL
Troponin I: <0.03 ng/mL

## 2018-04-10 LAB — TSH: TSH: 0.14 u[IU]/mL — AB (ref 0.350–4.500)

## 2018-04-10 LAB — HIV ANTIBODY (ROUTINE TESTING W REFLEX): HIV Screen 4th Generation wRfx: NONREACTIVE

## 2018-04-10 LAB — AMMONIA: Ammonia: 44 umol/L — ABNORMAL HIGH (ref 9–35)

## 2018-04-10 LAB — MAGNESIUM: Magnesium: 2.1 mg/dL (ref 1.7–2.4)

## 2018-04-10 LAB — HEMOGLOBIN A1C
HEMOGLOBIN A1C: 5.6 % (ref 4.8–5.6)
MEAN PLASMA GLUCOSE: 114.02 mg/dL

## 2018-04-10 LAB — GLUCOSE, CAPILLARY: Glucose-Capillary: 98 mg/dL (ref 65–99)

## 2018-04-10 MED ORDER — ONDANSETRON HCL 4 MG/2ML IJ SOLN: 4.0000 mg | Freq: Four times a day (QID) | INTRAMUSCULAR | Status: DC | PRN

## 2018-04-10 MED ORDER — ENOXAPARIN SODIUM 40 MG/0.4ML ~~LOC~~ SOLN
40.0000 mg | SUBCUTANEOUS | Status: DC
  Administered 2018-04-10 – 2018-04-13 (×4): 40 mg via SUBCUTANEOUS
  Filled 2018-04-10 (×4): qty 0.4

## 2018-04-10 MED ORDER — LEVALBUTEROL HCL 0.63 MG/3ML IN NEBU: 0.6300 mg | INHALATION_SOLUTION | Freq: Four times a day (QID) | RESPIRATORY_TRACT | Status: DC | PRN

## 2018-04-10 MED ORDER — ONDANSETRON HCL 4 MG PO TABS: 4.0000 mg | ORAL_TABLET | Freq: Four times a day (QID) | ORAL | Status: DC | PRN

## 2018-04-10 MED ORDER — LORAZEPAM 2 MG/ML IJ SOLN
2.0000 mg | Freq: Once | INTRAMUSCULAR | Status: AC
  Administered 2018-04-10: 2 mg via INTRAVENOUS
  Filled 2018-04-10: qty 1

## 2018-04-10 MED ORDER — LORAZEPAM 2 MG/ML IJ SOLN
2.0000 mg | INTRAMUSCULAR | Status: DC | PRN
  Administered 2018-04-10 – 2018-04-11 (×4): 2 mg via INTRAVENOUS
  Filled 2018-04-10 (×3): qty 1

## 2018-04-10 MED ORDER — SODIUM CHLORIDE 0.9 % IV SOLN
INTRAVENOUS | Status: DC
  Administered 2018-04-10: 04:00:00 via INTRAVENOUS

## 2018-04-10 MED ORDER — SODIUM CHLORIDE 0.9 % IV SOLN
500.0000 mg | Freq: Once | INTRAVENOUS | Status: AC
  Administered 2018-04-10: 500 mg via INTRAVENOUS
  Filled 2018-04-10: qty 500

## 2018-04-10 MED ORDER — ACETAMINOPHEN 650 MG RE SUPP: 650.0000 mg | Freq: Four times a day (QID) | RECTAL | Status: DC | PRN

## 2018-04-10 MED ORDER — LORAZEPAM 2 MG/ML IJ SOLN
INTRAMUSCULAR | Status: AC
  Filled 2018-04-10: qty 1

## 2018-04-10 MED ORDER — SODIUM CHLORIDE 0.9 % IV SOLN
3.0000 g | Freq: Four times a day (QID) | INTRAVENOUS | Status: DC
  Administered 2018-04-10 – 2018-04-13 (×13): 3 g via INTRAVENOUS
  Filled 2018-04-10 (×15): qty 3

## 2018-04-10 MED ORDER — HALOPERIDOL LACTATE 5 MG/ML IJ SOLN
5.0000 mg | Freq: Four times a day (QID) | INTRAMUSCULAR | Status: DC | PRN
  Administered 2018-04-10 – 2018-04-11 (×2): 5 mg via INTRAVENOUS
  Filled 2018-04-10 (×2): qty 1

## 2018-04-10 MED ORDER — SODIUM CHLORIDE 0.9 % IV SOLN
1.0000 g | Freq: Once | INTRAVENOUS | Status: AC
  Administered 2018-04-10: 1 g via INTRAVENOUS
  Filled 2018-04-10: qty 10

## 2018-04-10 MED ORDER — LORAZEPAM 2 MG/ML IJ SOLN: 2.0000 mg | Freq: Four times a day (QID) | INTRAMUSCULAR | Status: DC | PRN

## 2018-04-10 MED ORDER — LORAZEPAM 2 MG/ML IJ SOLN
1.0000 mg | Freq: Once | INTRAMUSCULAR | Status: AC
  Administered 2018-04-10: 1 mg via INTRAVENOUS
  Filled 2018-04-10: qty 1

## 2018-04-10 MED ORDER — ACETAMINOPHEN 325 MG PO TABS
650.0000 mg | ORAL_TABLET | Freq: Four times a day (QID) | ORAL | Status: DC | PRN
  Administered 2018-04-12 – 2018-04-13 (×2): 650 mg via ORAL
  Filled 2018-04-10 (×2): qty 2

## 2018-04-10 MED ORDER — AZITHROMYCIN 500 MG IV SOLR
INTRAVENOUS | Status: AC
  Filled 2018-04-10: qty 500

## 2018-04-10 NOTE — Progress Notes (Signed)
Patient unresponsive early shift, later became more responsive.Patient combative and pulling line, mittens added to hands. Foley placed due to retention, patient taken down to MRI twice and premedication the second time, patient unable to due procedure due to being combative. Patient  returned to floor and MD notified.

## 2018-04-10 NOTE — Progress Notes (Signed)
[facility] with [facility]/o of drug overdose, pt non verbal but eyes open, unable to follow command but reacts to painful stimuli, pt settled in bed with call light at bedside, admit doc paged for orders, was however reassured and will continue to monitor, v/s stable. Obasogie-Asidi, Kemaria Dedic Efe.

## 2018-04-10 NOTE — ED Notes (Signed)
Pt will awaken to voice, pt has not been verbal since arrival to ED. Pt will with draw to pain and has moved all extremities with no obvious weakness noted. Pt will not follow commands. Pt will occasionally nod head when asked questions.

## 2018-04-10 NOTE — Progress Notes (Signed)
4/12 @ 1839. 2nd attempt of MRI after added sedation. Pt combative, screaming and kicking inside scanner. Unable to obtain imaging.

## 2018-04-10 NOTE — ED Notes (Signed)
Pt's mother is at the bedside, states that normally the patient talks and walks and is able to function independently.  Pt wakes occasionally, but is unable to communicate verbally.  He continues to be very drowsy and obtunded.

## 2018-04-10 NOTE — ED Notes (Signed)
Unlabeled bottle of pills found in pt's pocket on arrival. Pill identifier used, 7 count of gabapentin 300mg  and 3 count of tylenol 500mg . Medications given to mother of pt.

## 2018-04-10 NOTE — ED Notes (Signed)
Pt's significant other wants to be contacted with any changes in pt status. This information given to [facility].

## 2018-04-10 NOTE — ED Notes (Signed)
[facility] here for transport. [facility]

## 2018-04-10 NOTE — H&P (Addendum)
[facility] History and Physical  Price Lachapelle ZOX:[id number] DOB: 1971-10-11 DOA: 04/09/2018  Referring physician: [facility]  PCP: Patient, No Pcp Per   Chief Complaint: AMS   HPI:  47 yr old M  with a history of cocaine and heroin abuse brought into [facility], after wife found him in a semiconscious  state in his living room, twitching like he was having a seizure , noticed more twitching  on the left side  , unable to speak, and get any words out. Patient was known to be using heroin yesterday evening and wife was aware. Wife states that she administered 3 doses of Narcan at home prior to EMS arrival. He was also found to be snorting a gray colored powder prior to EMS arrival. No significant change in his mental status after Narcan administration. He apparently received a total of 1.2 mg. Patient found to be nonverbal, withdrawing to pain and noxious stimuli, otherwise no meaningful interaction with providers ED course Blood pressure 115/82, pulse (!) 113, temperature 98.4 F (36.9 C), temperature source Oral, resp. rate 18, height 5\' 6"  (1.676 m), weight 90.7 kg (200 lb), SpO2 99 %. Chest x-ray suspicious for aspiration pneumonia patient received Rocephin azithromycin No ABG prior to transfer         Review of Systems: negative for the following  Unable to obtain due to altered mental status    Past Medical History:  Diagnosis Date  . Anemia   . Anxiety   . Arthritis   . Asthma   . Chronic back pain   . Cocaine abuse (HCC)   . GERD (gastroesophageal reflux disease)   . GI bleed   . Kidney stone   . Opiate abuse, continuous (HCC)   . Pneumonia   . Subdural hematoma [facility])      Past Surgical History:  Procedure Laterality Date  . CARPAL TUNNEL RELEASE    . ORIF CLAVICULAR FRACTURE    . ORIF ELBOW FRACTURE    . TIBIA FRACTURE SURGERY        Social History:  reports that he has been smoking cigarettes.  He has never used smokeless tobacco. He reports that he has  current or past drug history. Drug: Cocaine. His alcohol history is not on file.    Allergies  Allergen Reactions  . Ibuprofen Hives  . Toradol [Ketorolac Tromethamine] Hives        FAMILY HISTORY  When questioned  Directly-patient reports  No family history of HTN, CVA ,DIABETES, TB, Cancer CAD, Bleeding Disorders, Sickle Cell, diabetes, anemia, asthma,   Prior to Admission medications   Not on File     Physical Exam: Vitals:   04/10/18 0300 04/10/18 0400 04/10/18 0500 04/10/18 0555  BP: 100/73 112/78 115/80 107/76  Pulse: 95 89 84 90  Resp: 14 15 17 16   Temp:    98.6 F (37 C)  TempSrc:    Oral  SpO2: 97% 94% 98% 98%  Weight:      Height:            Vitals:   04/10/18 0300 04/10/18 0400 04/10/18 0500 04/10/18 0555  BP: 100/73 112/78 115/80 107/76  Pulse: 95 89 84 90  Resp: 14 15 17 16   Temp:    98.6 F (37 C)  TempSrc:    Oral  SpO2: 97% 94% 98% 98%  Weight:      Height:       Constitutional: Somewhat arousable, appears disoriented, nonverbal Eyes: PERRL, lids and conjunctivae normal  ENMT: Mucous membranes are moist. Posterior pharynx clear of any exudate or lesions.Normal dentition.  Neck: normal, supple, no masses, no thyromegaly Respiratory: clear to auscultation bilaterally, no wheezing, no crackles. Normal respiratory effort. No accessory muscle use.  Cardiovascular: Regular rate and rhythm, no murmurs / rubs / gallops. No extremity edema. 2+ pedal pulses. No carotid bruits.  Abdomen: no tenderness, no masses palpated. No hepatosplenomegaly. Bowel sounds positive.  Musculoskeletal: no clubbing / cyanosis. No joint deformity upper and lower extremities. Good ROM, no contractures. Normal muscle tone.  Skin: no rashes, lesions, ulcers. No induration Neurologic: CN 2-12 unable to be tested, nonverbal Psychiatric:  Judgment impaired    Labs on Admission: I have personally reviewed following labs and imaging studies  CBC: Recent Labs  Lab  04/09/18 2249  WBC 12.0*  NEUTROABS 8.3*  HGB 9.1*  HCT 26.9*  MCV 82.0  PLT 626*    Basic Metabolic Panel: Recent Labs  Lab 04/09/18 2249  NA 134*  K 3.9  CL 104  CO2 21*  GLUCOSE 90  BUN 6  CREATININE 0.75  CALCIUM 8.6*    GFR: Estimated Creatinine Clearance: 121.7 mL/min (by C-G formula based on SCr of 0.75 mg/dL).  Liver Function Tests: No results for input(s): AST, ALT, ALKPHOS, BILITOT, PROT, ALBUMIN in the last 168 hours. No results for input(s): LIPASE, AMYLASE in the last 168 hours. No results for input(s): AMMONIA in the last 168 hours.  Coagulation Profile: No results for input(s): INR, PROTIME in the last 168 hours. No results for input(s): DDIMER in the last 72 hours.  Cardiac Enzymes: No results for input(s): CKTOTAL, CKMB, CKMBINDEX, TROPONINI in the last 168 hours.  BNP (last 3 results) No results for input(s): PROBNP in the last 8760 hours.  HbA1C: No results for input(s): HGBA1C in the last 72 hours. No results found for: HGBA1C   CBG: Recent Labs  Lab 04/09/18 2222  GLUCAP 88    Lipid Profile: No results for input(s): CHOL, HDL, LDLCALC, TRIG, CHOLHDL, LDLDIRECT in the last 72 hours.  Thyroid Function Tests: No results for input(s): TSH, T4TOTAL, FREET4, T3FREE, THYROIDAB in the last 72 hours.  Anemia Panel: No results for input(s): VITAMINB12, FOLATE, FERRITIN, TIBC, IRON, RETICCTPCT in the last 72 hours.  Urine analysis: No results found for: COLORURINE, APPEARANCEUR, LABSPEC, PHURINE, GLUCOSEU, HGBUR, BILIRUBINUR, KETONESUR, PROTEINUR, UROBILINOGEN, NITRITE, LEUKOCYTESUR  Sepsis Labs: @LABRCNTIP (procalcitonin:4,lacticidven:4) )No results found for this or any previous visit (from the past 240 hour(s)).       Radiological Exams on Admission: Ct Head Wo Contrast  Result Date: 04/09/2018 CLINICAL DATA:  Altered level of consciousness. EXAM: CT HEAD WITHOUT CONTRAST TECHNIQUE: Contiguous axial images were obtained from the  base of the skull through the vertex without intravenous contrast. COMPARISON:  Head CT 02/21/2016 FINDINGS: Brain: Unchanged general atrophy, advanced for age. Mild chronic small vessel ischemia. No intracranial hemorrhage, mass effect, or midline shift. No hydrocephalus. The basilar cisterns are patent. No evidence of territorial infarct or acute ischemia. No extra-axial or intracranial fluid collection. Vascular: No hyperdense vessel or unexpected calcification. Skull: No fracture or focal lesion. Sinuses/Orbits: Right maxillary sinus mucosal thickening, new from prior. Other: None. IMPRESSION: 1.  No acute intracranial abnormality. 2. Stable generalized atrophy, age advanced. 3. Right maxillary sinus inflammation. Electronically Signed   By: Rubye Oaks M.D.   On: 04/09/2018 23:19   Dg Chest Portable 1 View  Result Date: 04/10/2018 CLINICAL DATA:  Initial evaluation for acute cough. EXAM: PORTABLE CHEST 1 VIEW  COMPARISON:  None available. FINDINGS: Cardiac and mediastinal silhouettes are within normal limits. Lungs are hypoinflated. Hazy opacity within the left mid and lower lung, suspicious for possible infiltrates given provided history of cough. Mild perihilar vascular congestion without pulmonary edema. No pleural effusion. No pneumothorax. No acute osseus abnormality. Sequelae of prior ORIF noted at the right clavicle. IMPRESSION: Hazy opacities within the left mid and lower lung, suspicious for possible infiltrates given the provided history of cough. Electronically Signed   By: Rise MuBenjamin  McClintock M.D.   On: 04/10/2018 02:41   Ct Head Wo Contrast  Result Date: 04/09/2018 CLINICAL DATA:  Altered level of consciousness. EXAM: CT HEAD WITHOUT CONTRAST TECHNIQUE: Contiguous axial images were obtained from the base of the skull through the vertex without intravenous contrast. COMPARISON:  Head CT 02/21/2016 FINDINGS: Brain: Unchanged general atrophy, advanced for age. Mild chronic small vessel  ischemia. No intracranial hemorrhage, mass effect, or midline shift. No hydrocephalus. The basilar cisterns are patent. No evidence of territorial infarct or acute ischemia. No extra-axial or intracranial fluid collection. Vascular: No hyperdense vessel or unexpected calcification. Skull: No fracture or focal lesion. Sinuses/Orbits: Right maxillary sinus mucosal thickening, new from prior. Other: None. IMPRESSION: 1.  No acute intracranial abnormality. 2. Stable generalized atrophy, age advanced. 3. Right maxillary sinus inflammation. Electronically Signed   By: Rubye OaksMelanie  Ehinger M.D.   On: 04/09/2018 23:19   Dg Chest Portable 1 View  Result Date: 04/10/2018 CLINICAL DATA:  Initial evaluation for acute cough. EXAM: PORTABLE CHEST 1 VIEW COMPARISON:  None available. FINDINGS: Cardiac and mediastinal silhouettes are within normal limits. Lungs are hypoinflated. Hazy opacity within the left mid and lower lung, suspicious for possible infiltrates given provided history of cough. Mild perihilar vascular congestion without pulmonary edema. No pleural effusion. No pneumothorax. No acute osseus abnormality. Sequelae of prior ORIF noted at the right clavicle. IMPRESSION: Hazy opacities within the left mid and lower lung, suspicious for possible infiltrates given the provided history of cough. Electronically Signed   By: Rise MuBenjamin  McClintock M.D.   On: 04/10/2018 02:41      EKG: Independently reviewed.   Assessment/Plan Principal Problem:   Acute metabolic encephalopathy Suspect this is multifactorial secondary to polysubstance abuse UDS positive for opiates and cocaine Initial CT of the head was negative for acute intracranial process Cannot rule out CVA given the fact that the patient has not been able to speak, MRI of the brain to rule out acute infarct in the setting of cocaine use Check TSH, ammonia level, HIV ABG Neurochecks every 4 Nothing by mouth until patient is able to be assessed by speech  therapy       Aspiration pneumonia (HCC) Likely secondary to above Patient will be treated with Unasyn Aspiration precautions Speech therapy evaluation    Polysubstance abuse (HCC) Wife states that the patient is a habitual user She states that some social service group had dropped off some Narcan ,at her house ,which she administered herself prior to calling EMS UDS positive for opiates and cocaine Will request social worker consult    DVT prophylaxis: Lovenox     Code Status Orders full code   (From admission, onward)        Start     Ordered   04/10/18 0740  Full code  Continuous     04/10/18 0742    Code Status History    This patient has a current code status but no historical code status.       consults  called:  Family Communication: Admission, patients condition and plan of care including tests being ordered have been discussed with the patient  who indicates understanding and agree with the plan and Code Status  Admission status: inpatient   Disposition plan: Further plan will depend as patient's clinical course evolves and further radiologic and laboratory data become available. Likely home when stable   At the time of admission, it appears that the appropriate admission status for this patient is INPATIENT .Thisis judged to be reasonable and necessary in order to provide the required intensity of service to ensure the patient's safetygiven thepresenting symptoms, physical exam findings, and initial radiographic and laboratory data in the context of their chronic comorbidities.   Richarda Overlie MD [facility] Pager [phone]  If 7PM-7AM, please contact night-coverage [email or web address] Password [facility]  04/10/2018, 7:44 AM

## 2018-04-10 NOTE — Care Management Note (Signed)
Case Management Note  Patient Details  Name: Samuel Barrett MRN: [id number] Date of Birth: [birth year]  Subjective/Objective:   Acute Metabolic Encephalopathy / Drug OD             Action/Plan: Patient lives at home with spouse; No PCP/ no medical insurance; Financial Counselor to see pt; SW consulted also. CM will continue to follow for progression of care.  Expected Discharge Date:   possibly 04/14/2018               Expected Discharge Plan:  Home/Self Care  In-House Referral:  Clinical Social Work  Discharge planning Services  CM Consult    Status of Service:   In progress  Reola MosherChandler, Shaylan Tutton L, RN,MHA,BSN [phone] 04/10/2018, 12:53 PM

## 2018-04-10 NOTE — Progress Notes (Signed)
Pharmacy Antibiotic Note  Samuel Barrett is a 47 y.o. male admitted on 04/09/2018 with possible drug overdose now has concern for aspiration pneumonia.  Pharmacy has been consulted for Unasyn dosing. WBC 12.0  Plan: Unasyn 3g IV q6 hours  Height: 5\' 6"  (167.6 cm) Weight: 200 lb (90.7 kg) IBW/kg (Calculated) : 63.8  Temp (24hrs), Avg:98.5 F (36.9 C), Min:98.4 F (36.9 C), Max:98.7 F (37.1 C)  Recent Labs  Lab 04/09/18 2249  WBC 12.0*  CREATININE 0.75    Estimated Creatinine Clearance: 121.7 mL/min (by C-G formula based on SCr of 0.75 mg/dL).    Allergies  Allergen Reactions  . Ibuprofen Hives  . Toradol [Ketorolac Tromethamine] Hives    Thank you for allowing pharmacy to be a part of this patient's care.  Samuel Barrett 04/10/2018 9:57 AM

## 2018-04-10 NOTE — Progress Notes (Signed)
Pt taken off unit for MRI.  MRI called back to inform staff that  patient became combative and they were unable to perform procedure. MD notified.

## 2018-04-10 NOTE — Evaluation (Signed)
Clinical/Bedside Swallow Evaluation Patient Details  Name: Samuel Barrett MRN: [id number] Date of Birth: [birth year]  Today's Date: 04/10/2018 Time: SLP Start Time (ACUTE ONLY): 1219 SLP Stop Time (ACUTE ONLY): 1230 SLP Time Calculation (min) (ACUTE ONLY): 11 min  Past Medical History:  Past Medical History:  Diagnosis Date  . Anemia   . Anxiety   . Arthritis   . Asthma   . Chronic back pain   . Cocaine abuse (HCC)   . GERD (gastroesophageal reflux disease)   . GI bleed   . Kidney stone   . Opiate abuse, continuous (HCC)   . Pneumonia   . Subdural hematoma [facility](HCC)    Past Surgical History:  Past Surgical History:  Procedure Laterality Date  . CARPAL TUNNEL RELEASE    . ORIF CLAVICULAR FRACTURE    . ORIF ELBOW FRACTURE    . TIBIA FRACTURE SURGERY     HPI:  47 yr old male with a history of cocaine and heroin abuse [facility] being found by wife with MS changes - dx acute metabolic encephalopathy secondary to polysubstance abuse, concern for aspiration pna, head CT negative.    Assessment / Plan / Recommendation Clinical Impression  Pt alert, not following commands, right gaze preference, with poor acceptance and attention to POs; oral holding; multiple subswallows per bolus and intermittent coughing post-swallow of ice chips and water.  Pt vocalized unintelligibly, shook head side to side refusing POs and oral suctioning.  Currently at high risk for aspiration. Recommend continued NPO.  D/W pt's mother and sister, Samuel Barrett.  SLP will f/u next date to assess for improvements; if no change, may need cortrak.  SLP Visit Diagnosis: Dysphagia, unspecified (R13.10)    Aspiration Risk       Diet Recommendation   NPO       Other  Recommendations Oral Care Recommendations: Oral care QID   Follow up Recommendations Other (comment)(tba)      Frequency and Duration min 2x/week  2 weeks       Prognosis        Swallow Study   General Date of Onset: 04/09/18 HPI: 47 yr  old male with a history of cocaine and heroin abuse [facility] being [facility] by wife with MS changes - dx acute metabolic encephalopathy secondary to polysubstance abuse, concern for aspiration pna, head CT negative.  Type of Study: Bedside Swallow Evaluation Previous Swallow Assessment: no Diet Prior to this Study: NPO Temperature Spikes Noted: No Respiratory Status: Nasal cannula History of Recent Intubation: No Behavior/Cognition: Alert Oral Cavity Assessment: Other (comment)(pt pursing lips together - unable to examine oral cavity) Oral Care Completed by SLP: No Self-Feeding Abilities: Total assist Patient Positioning: Upright in bed Volitional Cough: Cognitively unable to elicit Volitional Swallow: Unable to elicit    Oral/Motor/Sensory Function Overall Oral Motor/Sensory Function: Other (comment)(symmetric at rest)   Ice Chips Ice chips: Impaired Presentation: Spoon Oral Phase Impairments: Poor awareness of bolus Oral Phase Functional Implications: Prolonged oral transit Pharyngeal Phase Impairments: Cough - Delayed;Cough - Immediate   Thin Liquid Thin Liquid: Impaired Presentation: Spoon Oral Phase Functional Implications: Prolonged oral transit Pharyngeal  Phase Impairments: Multiple swallows;Cough - Delayed    Nectar Thick Nectar Thick Liquid: Not tested   Honey Thick Honey Thick Liquid: Not tested   Puree Puree: Not tested   Solid   GO   Solid: Not tested        Blenda Mountsouture, Elide Stalzer Laurice 04/10/2018,1:43 PM

## 2018-04-10 NOTE — Plan of Care (Signed)
[facility] transfer discussed with Dr. Read DriversMolpus. [name] is a 47 year old male with PMH of polysubstance abuse; who presented after being found unresponsive with a white powder.  He was given 1.2 mg of Narcan without significant change in mental status.  Patient nonverbal and lethargic.  Vitals noted to be currently stable with O2 saturation maintained on 2 L of nasal cannula oxygen.  CT scan of the brain showed no acute abnormality.  UDS was positive for cocaine and opiates.  Labs revealed WBC 12.1, hemoglobin 9.1, platelets 626.  Chest x-ray showed possible infiltrate.  Patient was given empiric antibiotics of azithromycin and Rocephin.  Neurology consult had not been made.  Questioning possibility of anoxic brain injury.  Accepted to a telemetry bed.

## 2018-04-10 NOTE — ED Notes (Signed)
IV in left hand is difficult to flush, new IV started and medications moved to IV in right FA.

## 2018-04-11 DIAGNOSIS — G9341 Metabolic encephalopathy: Secondary | ICD-10-CM

## 2018-04-11 DIAGNOSIS — F1910 Other psychoactive substance abuse, uncomplicated: Secondary | ICD-10-CM

## 2018-04-11 DIAGNOSIS — G9340 Encephalopathy, unspecified: Secondary | ICD-10-CM

## 2018-04-11 DIAGNOSIS — J690 Pneumonitis due to inhalation of food and vomit: Secondary | ICD-10-CM

## 2018-04-11 LAB — COMPREHENSIVE METABOLIC PANEL
ALT: 15 U/L — ABNORMAL LOW (ref 17–63)
ANION GAP: 9 (ref 5–15)
AST: 19 U/L (ref 15–41)
Albumin: 2.9 g/dL — ABNORMAL LOW (ref 3.5–5.0)
Alkaline Phosphatase: 58 U/L (ref 38–126)
BILIRUBIN TOTAL: 0.5 mg/dL (ref 0.3–1.2)
BUN: 5 mg/dL — ABNORMAL LOW (ref 6–20)
CHLORIDE: 103 mmol/L (ref 101–111)
CO2: 24 mmol/L (ref 22–32)
Calcium: 8.7 mg/dL — ABNORMAL LOW (ref 8.9–10.3)
Creatinine, Ser: 0.66 mg/dL (ref 0.61–1.24)
Glucose, Bld: 89 mg/dL (ref 65–99)
POTASSIUM: 3.5 mmol/L (ref 3.5–5.1)
Sodium: 136 mmol/L (ref 135–145)
Total Protein: 6.7 g/dL (ref 6.5–8.1)

## 2018-04-11 LAB — PROTIME-INR
INR: 1.07
Prothrombin Time: 13.8 seconds (ref 11.4–15.2)

## 2018-04-11 LAB — CBC
HEMATOCRIT: 28.4 % — AB (ref 39.0–52.0)
Hemoglobin: 8.7 g/dL — ABNORMAL LOW (ref 13.0–17.0)
MCH: 25 pg — ABNORMAL LOW (ref 26.0–34.0)
MCHC: 30.6 g/dL (ref 30.0–36.0)
MCV: 81.6 fL (ref 78.0–100.0)
Platelets: 557 10*3/uL — ABNORMAL HIGH (ref 150–400)
RBC: 3.48 MIL/uL — AB (ref 4.22–5.81)
RDW: 15.3 % (ref 11.5–15.5)
WBC: 11.7 10*3/uL — AB (ref 4.0–10.5)

## 2018-04-11 LAB — GLUCOSE, CAPILLARY: Glucose-Capillary: 100 mg/dL — ABNORMAL HIGH (ref 65–99)

## 2018-04-11 MED ORDER — NICOTINE 21 MG/24HR TD PT24
21.0000 mg | MEDICATED_PATCH | Freq: Every day | TRANSDERMAL | Status: DC
  Administered 2018-04-11 – 2018-04-13 (×3): 21 mg via TRANSDERMAL
  Filled 2018-04-11 (×3): qty 1

## 2018-04-11 MED ORDER — DEXTROSE IN LACTATED RINGERS 5 % IV SOLN
INTRAVENOUS | Status: DC
Start: 2018-04-11 — End: 2018-04-14
  Administered 2018-04-11 (×2): via INTRAVENOUS

## 2018-04-11 MED ORDER — MORPHINE SULFATE (PF) 4 MG/ML IV SOLN
INTRAVENOUS | Status: AC
  Administered 2018-04-11: 4 mg via INTRAVENOUS
  Filled 2018-04-11: qty 1

## 2018-04-11 MED ORDER — FAMOTIDINE IN NACL 20-0.9 MG/50ML-% IV SOLN
20.0000 mg | Freq: Once | INTRAVENOUS | Status: AC
  Administered 2018-04-11: 20 mg via INTRAVENOUS
  Filled 2018-04-11: qty 50

## 2018-04-11 MED ORDER — HALOPERIDOL LACTATE 5 MG/ML IJ SOLN
5.0000 mg | Freq: Once | INTRAMUSCULAR | Status: AC
  Administered 2018-04-11: 5 mg via INTRAVENOUS
  Filled 2018-04-11: qty 1

## 2018-04-11 MED ORDER — PNEUMOCOCCAL VAC POLYVALENT 25 MCG/0.5ML IJ INJ
0.5000 mL | INJECTION | INTRAMUSCULAR | Status: AC
Start: 2018-04-12 — End: 2018-04-12
  Administered 2018-04-12: 0.5 mL via INTRAMUSCULAR
  Filled 2018-04-11: qty 0.5

## 2018-04-11 MED ORDER — MORPHINE SULFATE (PF) 2 MG/ML IV SOLN
1.0000 mg | INTRAVENOUS | Status: DC | PRN
  Administered 2018-04-11 (×2): 1 mg via INTRAVENOUS
  Filled 2018-04-11 (×2): qty 1

## 2018-04-11 MED ORDER — MORPHINE SULFATE (PF) 4 MG/ML IV SOLN
4.0000 mg | Freq: Once | INTRAVENOUS | Status: AC
Start: 2018-04-11 — End: 2018-04-11
  Administered 2018-04-11: 4 mg via INTRAVENOUS

## 2018-04-11 MED ORDER — LORAZEPAM 2 MG/ML IJ SOLN
2.0000 mg | INTRAMUSCULAR | Status: DC | PRN
  Administered 2018-04-11 (×4): 2 mg via INTRAVENOUS
  Administered 2018-04-11: 3 mg via INTRAVENOUS
  Administered 2018-04-12 – 2018-04-14 (×9): 2 mg via INTRAVENOUS
  Filled 2018-04-11 (×3): qty 1
  Filled 2018-04-11: qty 2
  Filled 2018-04-11: qty 1
  Filled 2018-04-11: qty 2
  Filled 2018-04-11 (×5): qty 1
  Filled 2018-04-11: qty 2
  Filled 2018-04-11 (×3): qty 1

## 2018-04-11 MED ORDER — HALOPERIDOL LACTATE 5 MG/ML IJ SOLN
5.0000 mg | INTRAMUSCULAR | Status: DC | PRN
  Administered 2018-04-11 – 2018-04-12 (×3): 5 mg via INTRAVENOUS
  Filled 2018-04-11 (×3): qty 1

## 2018-04-11 MED ORDER — THIAMINE HCL 100 MG/ML IJ SOLN
100.0000 mg | Freq: Every day | INTRAMUSCULAR | Status: DC
  Administered 2018-04-11 – 2018-04-13 (×3): 100 mg via INTRAVENOUS
  Filled 2018-04-11 (×3): qty 2

## 2018-04-11 NOTE — Progress Notes (Signed)
PROGRESS NOTE    Samuel Barrett  ZOX:[id number] DOB: 1971/06/12 DOA: 04/09/2018 PCP: Patient, No Pcp Per    Brief Narrative:  47 year old male who presented with altered mental status.  He does have the significant past medical history for cocaine and heroine abuse.  His wife found him lethargic, with twitching movements, and phasic.  Patient has been using heroin within the last 24 hours prior to hospitalization.  His wife gave him naloxone x3, without significant improvement of his symptoms.  Initial physical examination blood pressure was 115/82, heart rate 113, temperature 98.4, respiratory rate 18, oxygen saturation 99%.  Patient was arousable, disoriented and nonverbal, moist mucous membranes, lungs were clear to auscultation bilaterally, heart S1-S2 present rhythmic, the abdomen was soft nontender, no lower extremity edema.  Sodium 134, potassium 3.9, chloride 104, bicarb 21, glucose 90, BUN 6, creatinine 0.75, white count 12.0, hemoglobin 9.1, hematocrit 26.9, platelets 626, arterial blood gas 7.35/ 48.6/ 112/ 26.2/ 98%.  Urine analysis was negative for infection.  Drug screen positive for opiates and cocaine. Etho less than 10, salicylate less than 7.  Head CT with no acute changes, stable generalized atrophy.  Chest x-ray with left lower lobe infiltrate, interstitial and alveolar.  EKG sinus rhythm, 80 bpm, normal axis, normal intervals, positive premature ventricular complex.  Patient was admitted to the hospital with a working diagnosis of acute toxic encephalopathy due to opioids and cocaine intoxication, complicated by aspiration pneumonia, left lower lobe.     Assessment & Plan:   Principal Problem:   Acute metabolic encephalopathy Active Problems:   Aspiration pneumonia (HCC)   Polysubstance abuse (HCC)   Acute encephalopathy   1.  Toxic encephalopathy. Will continue neuro checks q 2 hours, aspiration precautions, placed on restrains for safety. Will add benzodiazepines per  withdrawal protocol, and continue haldol as needed q 4 hours. Patient uses alcohol daily at home. Continue hydration with IV fluids, will change to balance electrolyte solution and will add dextrose.   2.  Aspiration pneumonia/left lower lobe present on admission. Chest film personally reviewed noted infiltrate on the left upper lobe, will continue IV antibiotic therapy with Unasyn, follow on cell count and temperature curve. Continue oxymetry monitoring and supplemental 02 per [place].   3.  Polysubstance abuse. Will continue close monitoring, no evidence of suicidal behavior, aspiration precautions.     DVT prophylaxis: enoxaparin   Code Status:  full Family Communication: I spoke with patient's care giver at the bedside and all questions were addressed.  Disposition Plan: home when stable, may need snf   Consultants:      Procedures:     Antimicrobials:   IV Unasyn.     Subjective: Patient continue to be agitated and poorly response, reacquired sedation and restrains last night,. This am not able to communicate, all information from nursing and patient's girlfriend at the beside.   Objective: Vitals:   04/11/18 0500 04/11/18 0600 04/11/18 0700 04/11/18 0817  BP:    (!) 154/123  Pulse:    (!) 15  Resp: 13 15 (!) 23   Temp:      TempSrc:      SpO2:    98%  Weight:      Height:        Intake/Output Summary (Last 24 hours) at 04/11/2018 0842 Last data filed at 04/11/2018 0637 Gross per 24 hour  Intake -  Output 2900 ml  Net -2900 ml   Filed Weights   04/09/18 2221  Weight: 90.7 kg (200  lb)    Examination:   General: deconditioned and ill looking appearing.  Neurology: confused and combative, opens eyes to touch, not follwing commands E ENT: positive pallor, no icterus, dry mucosa moist Cardiovascular: No JVD. S1-S2 present, rhythmic, no gallops, rubs, or murmurs. No lower extremity edema. Pulmonary: decreased breath sounds bilaterally, poor air movement, no  wheezing, rhonchi or rales. Gastrointestinal. Abdomen with no organomegaly, non tender, no rebound or guarding Skin. No rashes Musculoskeletal: no joint deformities     Data Reviewed: I have personally reviewed following labs and imaging studies  CBC: Recent Labs  Lab 04/09/18 2249 04/11/18 0754  WBC 12.0* 11.7*  NEUTROABS 8.3*  --   HGB 9.1* 8.7*  HCT 26.9* 28.4*  MCV 82.0 81.6  PLT 626* 557*   Basic Metabolic Panel: Recent Labs  Lab 04/09/18 2249 04/10/18 0807  NA 134*  --   K 3.9  --   CL 104  --   CO2 21*  --   GLUCOSE 90  --   BUN 6  --   CREATININE 0.75  --   CALCIUM 8.6*  --   MG  --  2.1   GFR: Estimated Creatinine Clearance: 121.7 mL/min (by C-G formula based on SCr of 0.75 mg/dL). Liver Function Tests: No results for input(s): AST, ALT, ALKPHOS, BILITOT, PROT, ALBUMIN in the last 168 hours. No results for input(s): LIPASE, AMYLASE in the last 168 hours. Recent Labs  Lab 04/10/18 0807  AMMONIA 44*   Coagulation Profile: Recent Labs  Lab 04/11/18 0754  INR 1.07   Cardiac Enzymes: Recent Labs  Lab 04/10/18 0807 04/10/18 1235 04/10/18 1900  TROPONINI <0.03 <0.03 <0.03   BNP (last 3 results) No results for input(s): PROBNP in the last 8760 hours. HbA1C: Recent Labs    04/10/18 0807  HGBA1C 5.6   CBG: Recent Labs  Lab 04/09/18 2222 04/10/18 0822  GLUCAP 88 98   Lipid Profile: No results for input(s): CHOL, HDL, LDLCALC, TRIG, CHOLHDL, LDLDIRECT in the last 72 hours. Thyroid Function Tests: Recent Labs    04/10/18 0807  TSH 0.140*   Anemia Panel: No results for input(s): VITAMINB12, FOLATE, FERRITIN, TIBC, IRON, RETICCTPCT in the last 72 hours.    Radiology Studies: I have reviewed all of the imaging during this hospital visit personally     Scheduled Meds: . enoxaparin (LOVENOX) injection  40 mg Subcutaneous Q24H   Continuous Infusions: . sodium chloride 100 mL/hr at 04/10/18 0421  . ampicillin-sulbactam (UNASYN)  IV Stopped (04/11/18 0258)     LOS: 1 day        Mercedies Ganesh Annett Gulaaniel Ezme Duch, MD [facility] Pager [phone]

## 2018-04-11 NOTE — Progress Notes (Addendum)
  Speech Language Pathology Treatment: Dysphagia  Patient Details Name: Samuel Barrett MRN: [id number] DOB: 1971-07-20 Today's Date: 04/11/2018 Time: [id number] SLP Time Calculation (min) (ACUTE ONLY): 15 min  Assessment / Plan / Recommendation Clinical Impression  Pt seen for PO readiness. During this treatment session pt was alert and became increasingly agitated, moving around and pulling on wrist restraints. SLP provided some oral care to remove dried secretions from oral cavity as much as was tolerated by pt. Pt took several sips of thin liquid and 2 bites of puree without immediate overt s/s of aspiration. Pt would not tolerate further trials- shaking head, moaning, yelling "not now". Due to cognitive status/ poor awareness of POs, recommend continuing NPO but with meds crushed in puree if pt is alert/ responsive and accepting of POs, along with small sips of thin liquid following oral care for comfort. If cognitive status persists, may consider temporary alternative means of nutrition. SLP will continue to follow for PO readiness.   HPI HPI: 47 yr old male with a history of cocaine and heroin abuse [facility] after being found by wife with MS changes - dx acute metabolic encephalopathy secondary to polysubstance abuse, concern for aspiration pna, head CT negative.       SLP Plan  Continue with current plan of care       Recommendations  Diet recommendations: NPO Medication Administration: Crushed with puree Supervision: Staff to assist with self feeding;Full supervision/cueing for compensatory strategies Compensations: Slow rate;Small sips/bites Postural Changes and/or Swallow Maneuvers: Seated upright 90 degrees                Oral Care Recommendations: Oral care QID;Oral care prior to ice chip/H20 Follow up Recommendations: (TBD) SLP Visit Diagnosis: Dysphagia, unspecified (R13.10) Plan: Continue with current plan of care       GO                Metro KungAmy K Oleksiak,  MA, CCC-SLP 04/11/2018, 10:40 AM [phone]

## 2018-04-12 DIAGNOSIS — E876 Hypokalemia: Secondary | ICD-10-CM

## 2018-04-12 DIAGNOSIS — D649 Anemia, unspecified: Secondary | ICD-10-CM

## 2018-04-12 LAB — CBC WITH DIFFERENTIAL/PLATELET
BASOS PCT: 0 %
Basophils Absolute: 0 10*3/uL (ref 0.0–0.1)
EOS PCT: 3 %
Eosinophils Absolute: 0.2 10*3/uL (ref 0.0–0.7)
HEMATOCRIT: 29.6 % — AB (ref 39.0–52.0)
Hemoglobin: 9.2 g/dL — ABNORMAL LOW (ref 13.0–17.0)
Lymphocytes Relative: 28 %
Lymphs Abs: 2.2 10*3/uL (ref 0.7–4.0)
MCH: 25 pg — ABNORMAL LOW (ref 26.0–34.0)
MCHC: 31.1 g/dL (ref 30.0–36.0)
MCV: 80.4 fL (ref 78.0–100.0)
MONO ABS: 0.9 10*3/uL (ref 0.1–1.0)
MONOS PCT: 11 %
Neutro Abs: 4.7 10*3/uL (ref 1.7–7.7)
Neutrophils Relative %: 58 %
Platelets: 518 10*3/uL — ABNORMAL HIGH (ref 150–400)
RBC: 3.68 MIL/uL — ABNORMAL LOW (ref 4.22–5.81)
RDW: 15.3 % (ref 11.5–15.5)
WBC: 8.1 10*3/uL (ref 4.0–10.5)

## 2018-04-12 LAB — BASIC METABOLIC PANEL
Anion gap: 11 (ref 5–15)
BUN: <5 mg/dL — ABNORMAL LOW (ref 6–20)
CO2: 25 mmol/L (ref 22–32)
CREATININE: 0.72 mg/dL (ref 0.61–1.24)
Calcium: 8.8 mg/dL — ABNORMAL LOW (ref 8.9–10.3)
Chloride: 102 mmol/L (ref 101–111)
GFR calc non Af Amer: >60 mL/min (ref >60)
GLUCOSE: 100 mg/dL — AB (ref 65–99)
Potassium: 3.2 mmol/L — ABNORMAL LOW (ref 3.5–5.1)
Sodium: 138 mmol/L (ref 135–145)

## 2018-04-12 LAB — GLUCOSE, CAPILLARY
Glucose-Capillary: 77 mg/dL (ref 65–99)
Glucose-Capillary: 81 mg/dL (ref 65–99)
Glucose-Capillary: 93 mg/dL (ref 65–99)
Glucose-Capillary: 98 mg/dL (ref 65–99)

## 2018-04-12 MED ORDER — HYDRALAZINE HCL 20 MG/ML IJ SOLN
10.0000 mg | Freq: Four times a day (QID) | INTRAMUSCULAR | Status: DC | PRN
  Filled 2018-04-12: qty 1

## 2018-04-12 MED ORDER — MORPHINE SULFATE (PF) 2 MG/ML IV SOLN
2.0000 mg | INTRAVENOUS | Status: DC | PRN
  Administered 2018-04-12 – 2018-04-14 (×10): 2 mg via INTRAVENOUS
  Filled 2018-04-12 (×11): qty 1

## 2018-04-12 MED ORDER — POTASSIUM CHLORIDE 10 MEQ/100ML IV SOLN
10.0000 meq | INTRAVENOUS | Status: AC
  Administered 2018-04-12 (×2): 10 meq via INTRAVENOUS
  Filled 2018-04-12: qty 100

## 2018-04-12 MED ORDER — POTASSIUM CHLORIDE 10 MEQ/100ML IV SOLN
10.0000 meq | INTRAVENOUS | Status: DC
  Administered 2018-04-12 (×2): 10 meq via INTRAVENOUS
  Filled 2018-04-12 (×4): qty 100

## 2018-04-12 MED ORDER — DICLOFENAC SODIUM 1 % TD GEL
2.0000 g | Freq: Four times a day (QID) | TRANSDERMAL | Status: DC
  Administered 2018-04-12 – 2018-04-13 (×4): 2 g via TOPICAL
  Filled 2018-04-12: qty 100

## 2018-04-12 NOTE — Progress Notes (Signed)
PROGRESS NOTE    Labron Bloodgood  ZOX:[id number] DOB: 20-Apr-1971 DOA: 04/09/2018 PCP: Patient, No Pcp Per    Brief Narrative:  47 year old male who presented with altered mental status.  He does have the significant past medical history for cocaine and heroine abuse.  His wife found him lethargic, with twitching movements, and phasic.  Patient has been using heroin within the last 24 hours prior to hospitalization.  His wife gave him naloxone x3, without significant improvement of his symptoms.  Initial physical examination blood pressure was 115/82, heart rate 113, temperature 98.4, respiratory rate 18, oxygen saturation 99%.  Patient was arousable, disoriented and nonverbal, moist mucous membranes, lungs were clear to auscultation bilaterally, heart S1-S2 present rhythmic, the abdomen was soft nontender, no lower extremity edema.  Sodium 134, potassium 3.9, chloride 104, bicarb 21, glucose 90, BUN 6, creatinine 0.75, white count 12.0, hemoglobin 9.1, hematocrit 26.9, platelets 626, arterial blood gas 7.35/ 48.6/ 112/ 26.2/ 98%.  Urine analysis was negative for infection.  Drug screen positive for opiates and cocaine. Etho less than 10, salicylate less than 7.  Head CT with no acute changes, stable generalized atrophy.  Chest x-ray with left lower lobe infiltrate, interstitial and alveolar.  EKG sinus rhythm, 80 bpm, normal axis, normal intervals, positive premature ventricular complex.  Patient was admitted to the hospital with a working diagnosis of acute toxic encephalopathy due to opioids and cocaine intoxication, complicated by aspiration pneumonia, left lower lobe.      Assessment & Plan:   Principal Problem:   Acute metabolic encephalopathy Active Problems:   Aspiration pneumonia (HCC)   Polysubstance abuse (HCC)   Acute encephalopathy   1.  Toxic encephalopathy. Clinically patient has been more awake and reactive, still reacquiring restrains for safety, continue benzodiazepines per  withdrawal protocol, low dose morphine for pain control and nicotine patch. Continue aspiration precautions and neuro checks. Continue hydration with balanced electrolyte solutions plus dextrose.    2.  Aspiration pneumonia/left lower lobe present on admission. Continue IV antibiotic therapy with Unasyn, aspiration precautions, oxymetry monitoring and supplemental 02 per [place]. Oxymetry at 100% on room air. Will repeat chest film in 24 to 48 hours if resolution of infiltrate will indicate aspiration pneumonitis and will discontinue antibiotic therapy.    3.  Polysubstance abuse. Continue supportive medical care, will need social services at discharge.   4. Right leg/ foot pain. Will add topical diclofenac for pain control.   5. Hypokalemia. Will correct K with IV kcl, renal function preserved, follow electrolytes in am including magnesium.   DVT prophylaxis: enoxaparin   Code Status:  full Family Communication: no family at the bedside.   Disposition Plan: home when stable, may need snf   Consultants:      Procedures:     Antimicrobials:   IV Unasyn.     Subjective: Patient is more awake and responsive, tolerating well benzodiazepines, has been complaining of right foot pain. Continue to be npo.   Objective: Vitals:   04/12/18 0400 04/12/18 0409 04/12/18 0500 04/12/18 0600  BP:  (!) 173/112    Pulse:  85    Resp: (!) 26 19 18  (!) 23  Temp:  97.6 F (36.4 C)    TempSrc:  Axillary    SpO2:  100%    Weight:      Height:        Intake/Output Summary (Last 24 hours) at 04/12/2018 0758 Last data filed at 04/12/2018 0600 Gross per 24 hour  Intake -  Output 1775 ml  Net -1775 ml   Filed Weights   04/09/18 2221  Weight: 90.7 kg (200 lb)    Examination:   General: deconditioned and ill looking appearing Neurology: Awake and alert, non focal  E ENT: mild pallor, no icterus, oral mucosa moist Cardiovascular: No JVD. S1-S2 present, rhythmic, no gallops, rubs,  or murmurs. No lower extremity edema. Pulmonary: decreased breath sounds bilaterally, no wheezing, rhonchi or rales. Gastrointestinal. Abdomen mild distention with no organomegaly, non tender, no rebound or guarding Skin. No rashes Musculoskeletal: no joint deformities    Data Reviewed: I have personally reviewed following labs and imaging studies  CBC: Recent Labs  Lab 04/09/18 2249 04/11/18 0754  WBC 12.0* 11.7*  NEUTROABS 8.3*  --   HGB 9.1* 8.7*  HCT 26.9* 28.4*  MCV 82.0 81.6  PLT 626* 557*   Basic Metabolic Panel: Recent Labs  Lab 04/09/18 2249 04/10/18 0807 04/11/18 0754  NA 134*  --  136  K 3.9  --  3.5  CL 104  --  103  CO2 21*  --  24  GLUCOSE 90  --  89  BUN 6  --  5*  CREATININE 0.75  --  0.66  CALCIUM 8.6*  --  8.7*  MG  --  2.1  --    GFR: Estimated Creatinine Clearance: 121.7 mL/min (by C-G formula based on SCr of 0.66 mg/dL). Liver Function Tests: Recent Labs  Lab 04/11/18 0754  AST 19  ALT 15*  ALKPHOS 58  BILITOT 0.5  PROT 6.7  ALBUMIN 2.9*   No results for input(s): LIPASE, AMYLASE in the last 168 hours. Recent Labs  Lab 04/10/18 0807  AMMONIA 44*   Coagulation Profile: Recent Labs  Lab 04/11/18 0754  INR 1.07   Cardiac Enzymes: Recent Labs  Lab 04/10/18 0807 04/10/18 1235 04/10/18 1900  TROPONINI <0.03 <0.03 <0.03   BNP (last 3 results) No results for input(s): PROBNP in the last 8760 hours. HbA1C: Recent Labs    04/10/18 0807  HGBA1C 5.6   CBG: Recent Labs  Lab 04/09/18 2222 04/10/18 0822 04/11/18 1240 04/12/18 0020 04/12/18 0635  GLUCAP 88 98 100* 81 98   Lipid Profile: No results for input(s): CHOL, HDL, LDLCALC, TRIG, CHOLHDL, LDLDIRECT in the last 72 hours. Thyroid Function Tests: Recent Labs    04/10/18 0807  TSH 0.140*   Anemia Panel: No results for input(s): VITAMINB12, FOLATE, FERRITIN, TIBC, IRON, RETICCTPCT in the last 72 hours.    Radiology Studies: I have reviewed all of the imaging  during this hospital visit personally     Scheduled Meds: . enoxaparin (LOVENOX) injection  40 mg Subcutaneous Q24H  . nicotine  21 mg Transdermal Daily  . pneumococcal 23 valent vaccine  0.5 mL Intramuscular Tomorrow-1000  . thiamine  100 mg Intravenous Daily   Continuous Infusions: . ampicillin-sulbactam (UNASYN) IV Stopped (04/12/18 0329)  . dextrose 5% lactated ringers 100 mL/hr at 04/11/18 2347     LOS: 2 days        Coralie KeensMauricio Daniel Arrien, MD [facility] Pager [phone]

## 2018-04-12 NOTE — Progress Notes (Signed)
Ensure Enilve shake given for CBG of 77. MD notified of BP 153/106, MAP 121, HR >100.

## 2018-04-12 NOTE — Progress Notes (Addendum)
  Speech Language Pathology Treatment: Dysphagia  Patient Details Name: Samuel Barrett MRN: [id number] DOB: 1971/01/13 Today's Date: 04/12/2018 Time: 1215-1225 SLP Time Calculation (min) (ACUTE ONLY): 10 min  Assessment / Plan / Recommendation Clinical Impression  Pt required multimodal cues and encouragement but participatory in po trials with girlfriend at bedside. Trials limited with pt consuming 2 bites puree, one bite cracker and sips water. No s/s aspiration; no intubation this admission. Risk is more related to behavior with pt talking during mastication, immediately after swallow and general cooperation. Recommend thin liquids, Dys 3 with probable upgrade to regular during next session, pills whole in applesauce, full supervision and ST intervention.    HPI HPI: 47 yr old male with a history of cocaine and heroin abuse [facility] after being found by wife with MS changes - dx acute metabolic encephalopathy secondary to polysubstance abuse, concern for aspiration pna, head CT negative.       SLP Plan  Continue with current plan of care       Recommendations  Diet recommendations: Dysphagia 3 (mechanical soft);Thin liquid Liquids provided via: Cup;Straw Medication Administration: Whole meds with puree Supervision: Patient able to self feed;Full supervision/cueing for compensatory strategies Compensations: Slow rate;Small sips/bites Postural Changes and/or Swallow Maneuvers: Seated upright 90 degrees                Oral Care Recommendations: Oral care BID Follow up Recommendations: None SLP Visit Diagnosis: Dysphagia, unspecified (R13.10) Plan: Continue with current plan of care       GO                Royce MacadamiaLitaker, River Mckercher Willis 04/12/2018, 1:04 PM  Breck CoonsLisa Willis Lonell FaceLitaker M.Ed [employer]-SLP Pager [phone]

## 2018-04-13 ENCOUNTER — Inpatient Hospital Stay (HOSPITAL_COMMUNITY): Payer: Medicaid Other

## 2018-04-13 DIAGNOSIS — T50901A Poisoning by unspecified drugs, medicaments and biological substances, accidental (unintentional), initial encounter: Secondary | ICD-10-CM

## 2018-04-13 LAB — BASIC METABOLIC PANEL
ANION GAP: 11 (ref 5–15)
BUN: <5 mg/dL — ABNORMAL LOW (ref 6–20)
CHLORIDE: 105 mmol/L (ref 101–111)
CO2: 23 mmol/L (ref 22–32)
Calcium: 9 mg/dL (ref 8.9–10.3)
Creatinine, Ser: 0.78 mg/dL (ref 0.61–1.24)
GFR calc non Af Amer: >60 mL/min (ref >60)
Glucose, Bld: 98 mg/dL (ref 65–99)
POTASSIUM: 3.6 mmol/L (ref 3.5–5.1)
Sodium: 139 mmol/L (ref 135–145)

## 2018-04-13 LAB — GLUCOSE, CAPILLARY: Glucose-Capillary: 99 mg/dL (ref 65–99)

## 2018-04-13 LAB — MAGNESIUM: Magnesium: 2.3 mg/dL (ref 1.7–2.4)

## 2018-04-13 MED ORDER — DIPHENOXYLATE-ATROPINE 2.5-0.025 MG PO TABS: 1.0000 | ORAL_TABLET | Freq: Four times a day (QID) | ORAL | Status: DC | PRN

## 2018-04-13 MED ORDER — LOPERAMIDE HCL 2 MG PO CAPS
4.0000 mg | ORAL_CAPSULE | Freq: Once | ORAL | Status: AC
  Administered 2018-04-13: 4 mg via ORAL
  Filled 2018-04-13: qty 2

## 2018-04-13 MED ORDER — POTASSIUM CHLORIDE CRYS ER 20 MEQ PO TBCR
40.0000 meq | EXTENDED_RELEASE_TABLET | Freq: Once | ORAL | Status: AC
  Administered 2018-04-13: 40 meq via ORAL
  Filled 2018-04-13: qty 2

## 2018-04-13 NOTE — Progress Notes (Signed)
Pt pleasant and cooperative stating he is in pain 6/10 , PRN Morphine given for the management of symptoms.

## 2018-04-13 NOTE — Progress Notes (Signed)
PROGRESS NOTE    Larita FifeDanny Bickert  ZOX:[id number] DOB: August 23, 1971 DOA: 04/09/2018 PCP: Patient, No Pcp Per    Brief Narrative:  47 year old male who presented with altered mental status. He does have the significant past medical history for cocaine and heroine abuse. His wife found him lethargic, with twitching movements, andphasic. Patient has been using heroin within the last 24 hours prior to hospitalization. His wife gave him naloxone x3, without significant improvement of his symptoms. Initial physical examination blood pressure was 115/82, heart rate 113, temperature 98.4, respiratory rate 18, oxygen saturation 99%. Patient was arousable, disoriented and nonverbal, moist mucous membranes, lungs were clear to auscultation bilaterally, heart S1-S2 present rhythmic, the abdomen was soft nontender, no lower extremity edema. Sodium 134, potassium 3.9, chloride 104, bicarb 21, glucose 90, BUN 6, creatinine 0.75, white count 12.0, hemoglobin 9.1, hematocrit 26.9, platelets 626, arterial blood gas 7.35/ 48.6/ 112/ 26.2/ 98%.Urine analysis was negative for infection. Drug screen positive for opiates and cocaine. Etholess than 10, salicylate less than 7.Head CT with no acute changes, stable generalized atrophy. Chest x-ray with left lower lobe infiltrate, interstitial and alveolar.EKG sinus rhythm, 80 bpm, normal axis, normal intervals, positive premature ventricular complex.  Patient was admitted to the hospital with a working diagnosis of acute toxic encephalopathy due to opioids and cocaine intoxication, complicated by aspiration pneumonia,left lower lobe.   Assessment & Plan:   Principal Problem:   Acute metabolic encephalopathy Active Problems:   Aspiration pneumonia (HCC)   Polysubstance abuse (HCC)   Acute encephalopathy   1.Toxic encephalopathy. Patient is more awake and alert, responding to questions appropriately, no confusion or agitation, will continue to advance diet,  have patient out of bed and ambulating in preparation for discharge in the next 24 hours, continue lorazepam per withdrawal protocol, along with thiamine and multivitamins. Patient denies suicidal behavior. Continue gentle hydration and neuro checks.     2.Aspiration pneumonia/left lower lobe present on admission. Patient has been afebrile, no leukocytosis, cough or dyspnea, follow chest film personally reviewed resolved infiltrate in the left lower lobe, will dc antibiotic therapy. Incidental nodule on the right upper lobe, will need outpatient follow up.   3.Polysubstance abuse. Supportive medical care, denies suicidal behavior, care giver at the bedside.    4. Right leg/ foot pain. Improved with diclofenac and low dose morphine for pain control.  5. Hypokalemia. Continue k correction with kcl, follow on renal panel in am, patient tolerating po well, will continue gentle hydration at a slower rate. Renal function continue to be preserved.   6. New diarrhea. Suspected antibiotic induced, follow chest film with no infiltrate, confirming aspiration pneumonitis, will dc antibiotic. Will add lomotil as needed for symptom control.   DVT prophylaxis:enoxaparin Code Status:full Family Communication:I spoke with patient's family at the bedside and all questions were addressed. Care giver at the bedside.   Disposition Plan:home when stable, may need snf   Consultants:    Procedures:    Antimicrobials:  .   Subjective: Patient is feeling better, able to communicate, improved leg pain, no nausea or vomiting. No dyspnea or cough. Positive diarrhea, but no abdominal pain.   Objective: Vitals:   04/12/18 1705 04/12/18 2043 04/12/18 2326 04/13/18 0600  BP: (!) 151/101 (!) 151/101 (!) 153/106   Pulse: 96 86 73 (!) 113  Resp: 17     Temp:  99.1 F (37.3 C) 98.9 F (37.2 C)   TempSrc:  Oral Oral   SpO2:      Weight:  Height:        Intake/Output Summary  (Last 24 hours) at 04/13/2018 0833 Last data filed at 04/13/2018 0500 Gross per 24 hour  Intake 340 ml  Output 450 ml  Net -110 ml   Filed Weights   04/09/18 2221  Weight: 90.7 kg (200 lb)    Examination:   General: Not in pain or dyspnea, deconditioned Neurology: Awake and alert, non focal  E ENT: mild pallor, no icterus, oral mucosa moist Cardiovascular: No JVD. S1-S2 present, rhythmic, no gallops, rubs, or murmurs. No lower extremity edema. Pulmonary: vesicular breath sounds bilaterally, adequate air movement, no wheezing, rhonchi or rales. Gastrointestinal. Abdomen with no organomegaly, non tender, no rebound or guarding Skin. No rashes Musculoskeletal: no joint deformities     Data Reviewed: I have personally reviewed following labs and imaging studies  CBC: Recent Labs  Lab 04/09/18 2249 04/11/18 0754 04/12/18 0746  WBC 12.0* 11.7* 8.1  NEUTROABS 8.3*  --  4.7  HGB 9.1* 8.7* 9.2*  HCT 26.9* 28.4* 29.6*  MCV 82.0 81.6 80.4  PLT 626* 557* 518*   Basic Metabolic Panel: Recent Labs  Lab 04/09/18 2249 04/10/18 0807 04/11/18 0754 04/12/18 0746 04/13/18 0356  NA 134*  --  136 138 139  K 3.9  --  3.5 3.2* 3.6  CL 104  --  103 102 105  CO2 21*  --  24 25 23   GLUCOSE 90  --  89 100* 98  BUN 6  --  5* <5* <5*  CREATININE 0.75  --  0.66 0.72 0.78  CALCIUM 8.6*  --  8.7* 8.8* 9.0  MG  --  2.1  --   --  2.3   GFR: Estimated Creatinine Clearance: 121.7 mL/min (by C-G formula based on SCr of 0.78 mg/dL). Liver Function Tests: Recent Labs  Lab 04/11/18 0754  AST 19  ALT 15*  ALKPHOS 58  BILITOT 0.5  PROT 6.7  ALBUMIN 2.9*   No results for input(s): LIPASE, AMYLASE in the last 168 hours. Recent Labs  Lab 04/10/18 0807  AMMONIA 44*   Coagulation Profile: Recent Labs  Lab 04/11/18 0754  INR 1.07   Cardiac Enzymes: Recent Labs  Lab 04/10/18 0807 04/10/18 1235 04/10/18 1900  TROPONINI <0.03 <0.03 <0.03   BNP (last 3 results) No results for  input(s): PROBNP in the last 8760 hours. HbA1C: No results for input(s): HGBA1C in the last 72 hours. CBG: Recent Labs  Lab 04/12/18 0020 04/12/18 0635 04/12/18 1632 04/12/18 2331 04/13/18 0604  GLUCAP 81 98 93 77 99   Lipid Profile: No results for input(s): CHOL, HDL, LDLCALC, TRIG, CHOLHDL, LDLDIRECT in the last 72 hours. Thyroid Function Tests: No results for input(s): TSH, T4TOTAL, FREET4, T3FREE, THYROIDAB in the last 72 hours. Anemia Panel: No results for input(s): VITAMINB12, FOLATE, FERRITIN, TIBC, IRON, RETICCTPCT in the last 72 hours.    Radiology Studies: I have reviewed all of the imaging during this hospital visit personally     Scheduled Meds: . diclofenac sodium  2 g Topical QID  . enoxaparin (LOVENOX) injection  40 mg Subcutaneous Q24H  . nicotine  21 mg Transdermal Daily  . thiamine  100 mg Intravenous Daily   Continuous Infusions: . ampicillin-sulbactam (UNASYN) IV Stopped (04/13/18 0317)  . dextrose 5% lactated ringers 100 mL/hr at 04/11/18 2347     LOS: 3 days        Mauricio Annett Gula, MD [facility] Pager [phone]

## 2018-04-13 NOTE — Progress Notes (Signed)
  Speech Language Pathology Treatment: Dysphagia  Patient Details Name: Shahir Karen MRN: [id number] DOB: December 25, 1971 Today's Date: 04/13/2018 Time: 2984-7308 SLP Time Calculation (min) (ACUTE ONLY): 8 min  Assessment / Plan / Recommendation Clinical Impression  Pt just completed lunch and declined any solids. Delayed throat clear x 1 following thin via straw. Pt's attention and awareness improved today. Pt's girlfriend stated he ate lunch without coughing or signs of difficulty. Verbally reminded pt to take small sips and not talk while masticating/swallowing. Although not observed with solids, SLP comfortable upgrading to regular texture. No follow up needed, please reconsult if needed.    HPI HPI: 47 yr old male with a history of cocaine and heroin abuse brought into [facility] after being found by wife with MS changes - dx acute metabolic encephalopathy secondary to polysubstance abuse, concern for aspiration pna, head CT negative.       SLP Plan  All goals met;Discharge SLP treatment due to (comment)       Recommendations  Diet recommendations: Regular;Thin liquid Liquids provided via: Cup;Straw Medication Administration: Whole meds with puree Supervision: Patient able to self feed;Full supervision/cueing for compensatory strategies Compensations: Slow rate;Small sips/bites Postural Changes and/or Swallow Maneuvers: Seated upright 90 degrees                Oral Care Recommendations: Oral care BID Follow up Recommendations: None SLP Visit Diagnosis: Dysphagia, unspecified (R13.10) Plan: All goals met;Discharge SLP treatment due to (comment)       GO                Houston Siren 04/13/2018, 1:47 PM  Orbie Pyo Colvin Caroli.Ed [employer] [phone]

## 2018-04-13 NOTE — Progress Notes (Signed)
Pt experiencing diarrhea. Contacted MD for imodium.

## 2018-04-13 NOTE — Care Management Note (Signed)
Case Management Note  Patient Details  Name: Drury Ardizzone MRN: [id number] Date of Birth: 02-14-1971  Subjective/Objective:   Pt admitted with acute metabolic encephalopathy and aspiration pneumonia. He is from home. No PCP and no insurance.                  Action/Plan: CM met with the patient and his significant other. Pt is interested in getting into one of the [facility] clinics. CM called and all the clinics are full for April. CM provided him and significant other with information and numbers to call May 1st. They voiced understanding.  CM following for potential medication assistance at d/c.   Expected Discharge Date:                  Expected Discharge Plan:  Home/Self Care  In-House Referral:  Clinical Social Work  Discharge planning Services  CM Consult  Post Acute Care Choice:    Choice offered to:     DME Arranged:    DME Agency:     HH Arranged:    HH Agency:     Status of Service:  In process, will continue to follow  If discussed at Long Length of Stay Meetings, dates discussed:    Additional Comments:  Pollie Friar, RN 04/13/2018, 1:27 PM

## 2018-04-14 DIAGNOSIS — R918 Other nonspecific abnormal finding of lung field: Secondary | ICD-10-CM

## 2018-04-14 DIAGNOSIS — J189 Pneumonia, unspecified organism: Secondary | ICD-10-CM

## 2018-04-14 LAB — BASIC METABOLIC PANEL
Anion gap: 11 (ref 5–15)
CHLORIDE: 107 mmol/L (ref 101–111)
CO2: 20 mmol/L — ABNORMAL LOW (ref 22–32)
CREATININE: 0.76 mg/dL (ref 0.61–1.24)
Calcium: 9.1 mg/dL (ref 8.9–10.3)
GFR calc non Af Amer: >60 mL/min (ref >60)
Glucose, Bld: 96 mg/dL (ref 65–99)
POTASSIUM: 3.9 mmol/L (ref 3.5–5.1)
SODIUM: 138 mmol/L (ref 135–145)

## 2018-04-14 LAB — GLUCOSE, CAPILLARY: GLUCOSE-CAPILLARY: 102 mg/dL — AB (ref 65–99)

## 2018-04-14 MED ORDER — ACETAMINOPHEN 500 MG PO TABS: 500.0000 mg | ORAL_TABLET | Freq: Four times a day (QID) | ORAL | 0 refills | Status: AC | PRN

## 2018-04-14 NOTE — Progress Notes (Signed)
Patient  Left the floor with his wife at [id number].  IV still intact, patient pulled his heart monitor monitor and  Walk out his room and left the floor with no discharge instruction.  Doctor Arrien notified

## 2018-04-14 NOTE — Discharge Summary (Signed)
Physician Discharge Summary  Samuel Barrett WUJ:[id number] DOB: 11-24-1971 DOA: 04/09/2018  PCP: Patient, No Pcp Per  Admit date: 04/09/2018 Discharge date: 04/14/2018  Admitted From: Home Disposition:  Home  Recommendations for Outpatient Follow-up and new medication changes:  1. Follow up with PCP in 1-weeks 2. Patient was advised about avoiding alcohol and illegal substance abuse. 3. Patient has left hospital without being seen by me or receiving discharge papers  Home Health: no   Equipment/Devices: no   Discharge Condition: stable CODE STATUS: full  Diet recommendation: Heart healthy   Brief/Interim Summary: 47 year old male who presented with altered mental status. He does have the significant past medical history for cocaine and heroine abuse. His girlfriend found him lethargic, with twitching movements, andaphasic. Patient has been using heroin within the last 24 hours prior to hospitalization. His girlfirend gave him naloxone x3, without significant improvement of his symptoms. Initial physical examination blood pressure was 115/82, heart rate 113, temperature 98.4, respiratory rate 18, oxygen saturation 99%. Patient was arousable, disoriented and nonverbal, moist mucous membranes, lungs were clear to auscultation bilaterally, heart S1-S2 present rhythmic, the abdomen was soft nontender, no lower extremity edema. Sodium 134, potassium 3.9, chloride 104, bicarb 21, glucose 90, BUN 6, creatinine 0.75, white count 12.0, hemoglobin 9.1, hematocrit 26.9, platelets 626, arterial blood gas 7.35/ 48.6/ 112/ 26.2/ 98%.Urine analysis was negative for infection. Drug screen positive for opiates and cocaine. Etholess than 10, salicylate less than 7.Head CT with no acute changes, stable generalized atrophy. Chest x-ray with left lower lobe infiltrate, interstitial and alveolar.EKG sinus rhythm, 80 bpm, normal axis, normal intervals, positive premature ventricular complex.  Patient was  admitted to the hospital with a working diagnosis of acute toxic encephalopathy due to opioids and cocaine intoxication, complicated by aspiration pneumonia,left lower lobe.  1.  Toxic encephalopathy, due to opiates and cocaine intoxication, complicated by alcohol withdrawal syndrome.  Patient was admitted to the stepdown unit, he received IV fluids with balanced crystalloid solutions and dextrose, frequent neuro checks, fall and aspiration precautions.  He was placed on alcohol withdrawal protocol with Lorazepam.  Patient received Haldol as needed as well for agitation.  Patient responded well to medical therapy, he did recover his consciousness and returnd back to his baseline.  His diet was advanced progressively with good toleration.  On April 16 he left the hospital without formal discharge, without my physical evaluation or his discharge papers.   2.  Aspiration pneumonitis, left lower lobe, present on admission.  Patient was placed on antibiotic therapy with Unasyn, oximetry monitoring and supplemental oxygen.  Further aspiration precautions.  Follow-up chest film showed complete resolution of infiltrate at the left lower lobe, from aspiration pneumonitis, antibiotics were discontinued.  3.  Incidental nodular density in the right upper lobe.  Incidental finding on follow up chest film, patient will need outpatient follow-up.  Will attempt to call patient over the phone to inform patient about this finding, since he left without being seen.   4.  Hypokalemia.  Patient received potassium chloride to correct his hypokalemia, his discharge potassium is 3.9, with preserved kidney function with serum creatinine 0.76.   5.  Polysubstance abuse.  Patient recovered well with supportive medical therapy, he was advised to avoid alcohol and illegal substance abuse, he denied suicidal behavior and this was confirmed by his caregiver at the bedside, his girlfriend.   6.  Right leg/foot pain.  History of  injury in the past, patient received topical diclofenac and low-dose morphine as needed,  with good toleration.  7.  Antibiotic induced diarrhea.  Patient developed diarrhea associated with antibiotic therapy, antibiotics were discontinued with improvement of symptoms.  8.  Hypertension.  Patient will resume amlodipine at discharge.  9.  Depression. Continue buspirone and mirtazapine, per home regimen.   Discharge Diagnoses:  Principal Problem:   Acute metabolic encephalopathy Active Problems:   Aspiration pneumonia (HCC)   Polysubstance abuse (HCC)   Acute encephalopathy    Discharge Instructions   Allergies as of 04/14/2018      Reactions   Ibuprofen Hives   Toradol [ketorolac Tromethamine] Hives   Tape Rash   Reaction to some types of tape      Medication List    STOP taking these medications   megestrol 400 MG/10ML suspension Commonly known as:  MEGACE     TAKE these medications   acetaminophen 500 MG tablet Commonly known as:  TYLENOL Take 1 tablet (500 mg total) by mouth every 6 (six) hours as needed for headache (pain). What changed:  how much to take   albuterol 108 (90 Base) MCG/ACT inhaler Commonly known as:  PROVENTIL HFA;VENTOLIN HFA Inhale 2 puffs into the lungs every 6 (six) hours as needed for wheezing or shortness of breath.   amLODipine 5 MG tablet Commonly known as:  NORVASC Take 5 mg by mouth daily.   busPIRone 10 MG tablet Commonly known as:  BUSPAR Take 10 mg by mouth 3 (three) times daily as needed (depression).   fluticasone 50 MCG/ACT nasal spray Commonly known as:  FLONASE Place 1 spray into both nostrils daily.   gabapentin 300 MG capsule Commonly known as:  NEURONTIN Take 1,200 mg by mouth 4 (four) times daily.   ipratropium 17 MCG/ACT inhaler Commonly known as:  ATROVENT HFA Inhale 2 puffs into the lungs every 6 (six) hours as needed for wheezing.   mirtazapine 15 MG tablet Commonly known as:  REMERON Take 15-45 mg by mouth  at bedtime as needed (sleep).   omeprazole 20 MG capsule Commonly known as:  PRILOSEC Take 40 mg by mouth 2 (two) times daily before a meal.   tiZANidine 4 MG tablet Commonly known as:  ZANAFLEX Take 4-12 mg by mouth at bedtime as needed for muscle spasms.       Allergies  Allergen Reactions  . Ibuprofen Hives  . Toradol [Ketorolac Tromethamine] Hives  . Tape Rash    Reaction to some types of tape    Consultations:     Procedures/Studies: Dg Chest 2 View  Result Date: 04/13/2018 CLINICAL DATA:  Infiltrate of lung present on chest x-ray. EXAM: CHEST - 2 VIEW COMPARISON:  None. FINDINGS: Trachea is midline. Heart is at the upper limits of normal in size. There is a nodular density in the right upper lung zone. Probable scarring adjacent to the left heart border. Biapical pleural thickening. No pleural fluid. Blunting of left costophrenic angle appears chronic. IMPRESSION: Nodular density in the right upper lung zone. CT chest without contrast is recommended, as clinically indicated, as malignancy cannot be excluded. Electronically Signed   By: Leanna Battles M.D.   On: 04/13/2018 10:38   Ct Head Wo Contrast  Result Date: 04/09/2018 CLINICAL DATA:  Altered level of consciousness. EXAM: CT HEAD WITHOUT CONTRAST TECHNIQUE: Contiguous axial images were obtained from the base of the skull through the vertex without intravenous contrast. COMPARISON:  Head CT 02/21/2016 FINDINGS: Brain: Unchanged general atrophy, advanced for age. Mild chronic small vessel ischemia. No intracranial hemorrhage, mass effect,  or midline shift. No hydrocephalus. The basilar cisterns are patent. No evidence of territorial infarct or acute ischemia. No extra-axial or intracranial fluid collection. Vascular: No hyperdense vessel or unexpected calcification. Skull: No fracture or focal lesion. Sinuses/Orbits: Right maxillary sinus mucosal thickening, new from prior. Other: None. IMPRESSION: 1.  No acute intracranial  abnormality. 2. Stable generalized atrophy, age advanced. 3. Right maxillary sinus inflammation. Electronically Signed   By: Rubye Oaks M.D.   On: 04/09/2018 23:19   Dg Chest Portable 1 View  Result Date: 04/10/2018 CLINICAL DATA:  Initial evaluation for acute cough. EXAM: PORTABLE CHEST 1 VIEW COMPARISON:  None available. FINDINGS: Cardiac and mediastinal silhouettes are within normal limits. Lungs are hypoinflated. Hazy opacity within the left mid and lower lung, suspicious for possible infiltrates given provided history of cough. Mild perihilar vascular congestion without pulmonary edema. No pleural effusion. No pneumothorax. No acute osseus abnormality. Sequelae of prior ORIF noted at the right clavicle. IMPRESSION: Hazy opacities within the left mid and lower lung, suspicious for possible infiltrates given the provided history of cough. Electronically Signed   By: Rise Mu M.D.   On: 04/10/2018 02:41       Subjective: Patient left the hospital, without my clinical evaluation.  Discharge Exam: Vitals:   04/14/18 0506 04/14/18 0800  BP: 125/84 122/74  Pulse: (!) 102 (!) 101  Resp: (!) 22 20  Temp: 97.9 F (36.6 C) 98.2 F (36.8 C)  SpO2: 98% 98%   Vitals:   04/13/18 2040 04/13/18 2350 04/14/18 0506 04/14/18 0800  BP: 120/73 111/66 125/84 122/74  Pulse: 97 (!) 106 (!) 102 (!) 101  Resp: (!) 21 (!) 22 (!) 22 20  Temp: 98.2 F (36.8 C) 99 F (37.2 C) 97.9 F (36.6 C) 98.2 F (36.8 C)  TempSrc: Oral Oral Oral Oral  SpO2: 96% 98% 98% 98%  Weight:      Height:        Patient left the hospital without my clinical evaluation.   The results of significant diagnostics from this hospitalization (including imaging, microbiology, ancillary and laboratory) are listed below for reference.     Microbiology: No results found for this or any previous visit (from the past 240 hour(s)).   Labs: BNP (last 3 results) No results for input(s): BNP in the last 8760  hours. Basic Metabolic Panel: Recent Labs  Lab 04/09/18 2249 04/10/18 0807 04/11/18 0754 04/12/18 0746 04/13/18 0356 04/14/18 0513  NA 134*  --  136 138 139 138  K 3.9  --  3.5 3.2* 3.6 3.9  CL 104  --  103 102 105 107  CO2 21*  --  24 25 23  20*  GLUCOSE 90  --  89 100* 98 96  BUN 6  --  5* <5* <5* <5*  CREATININE 0.75  --  0.66 0.72 0.78 0.76  CALCIUM 8.6*  --  8.7* 8.8* 9.0 9.1  MG  --  2.1  --   --  2.3  --    Liver Function Tests: Recent Labs  Lab 04/11/18 0754  AST 19  ALT 15*  ALKPHOS 58  BILITOT 0.5  PROT 6.7  ALBUMIN 2.9*   No results for input(s): LIPASE, AMYLASE in the last 168 hours. Recent Labs  Lab 04/10/18 0807  AMMONIA 44*   CBC: Recent Labs  Lab 04/09/18 2249 04/11/18 0754 04/12/18 0746  WBC 12.0* 11.7* 8.1  NEUTROABS 8.3*  --  4.7  HGB 9.1* 8.7* 9.2*  HCT 26.9* 28.4*  29.6*  MCV 82.0 81.6 80.4  PLT 626* 557* 518*   Cardiac Enzymes: Recent Labs  Lab 04/10/18 0807 04/10/18 1235 04/10/18 1900  TROPONINI <0.03 <0.03 <0.03   BNP: Invalid input(s): POCBNP CBG: Recent Labs  Lab 04/12/18 0635 04/12/18 1632 04/12/18 2331 04/13/18 0604 04/14/18 0604  GLUCAP 98 93 77 99 102*   D-Dimer No results for input(s): DDIMER in the last 72 hours. Hgb A1c No results for input(s): HGBA1C in the last 72 hours. Lipid Profile No results for input(s): CHOL, HDL, LDLCALC, TRIG, CHOLHDL, LDLDIRECT in the last 72 hours. Thyroid function studies No results for input(s): TSH, T4TOTAL, T3FREE, THYROIDAB in the last 72 hours.  Invalid input(s): FREET3 Anemia work up No results for input(s): VITAMINB12, FOLATE, FERRITIN, TIBC, IRON, RETICCTPCT in the last 72 hours. Urinalysis    Component Value Date/Time   COLORURINE YELLOW 04/10/2018 0739   APPEARANCEUR CLEAR 04/10/2018 0739   LABSPEC 1.009 04/10/2018 0739   PHURINE 5.0 04/10/2018 0739   GLUCOSEU NEGATIVE 04/10/2018 0739   HGBUR NEGATIVE 04/10/2018 0739   BILIRUBINUR NEGATIVE 04/10/2018 0739    KETONESUR NEGATIVE 04/10/2018 0739   PROTEINUR NEGATIVE 04/10/2018 0739   NITRITE NEGATIVE 04/10/2018 0739   LEUKOCYTESUR NEGATIVE 04/10/2018 0739   Sepsis Labs Invalid input(s): PROCALCITONIN,  WBC,  LACTICIDVEN Microbiology No results found for this or any previous visit (from the past 240 hour(s)).   Time coordinating discharge: 45 minutes  SIGNED:   Coralie Keens, MD  [facility] 04/14/2018, 11:17 AM Pager [phone]  If 7PM-7AM, please contact night-coverage [email or web address] Password [email or web address]

## 2018-04-14 NOTE — Progress Notes (Signed)
PT Cancellation Note  Patient Details Name: Samuel Barrett MRN: [id number] DOB: 10-15-1971   Cancelled Treatment:    Reason Eval/Treat Not Completed: Other (comment); per RN pt has left his room & likely d/c AMA.     Elray McgregorCynthia Wynn 04/14/2018, 10:44 AM  Sheran Lawlessyndi Wynn, PT [phone] 04/14/2018

## 2018-04-15 ENCOUNTER — Encounter (HOSPITAL_BASED_OUTPATIENT_CLINIC_OR_DEPARTMENT_OTHER): Payer: Self-pay | Admitting: Emergency Medicine

## 2018-05-02 ENCOUNTER — Encounter (HOSPITAL_COMMUNITY): Payer: Self-pay | Admitting: Emergency Medicine

## 2018-05-02 ENCOUNTER — Inpatient Hospital Stay (HOSPITAL_COMMUNITY)
Admission: EM | Admit: 2018-05-02 | Discharge: 2018-05-05 | DRG: 378 | Disposition: A | Discharge status: Home / Self Care | Payer: Medicaid Other | Attending: Internal Medicine | Admitting: Internal Medicine

## 2018-05-02 ENCOUNTER — Emergency Department (HOSPITAL_COMMUNITY): Payer: Medicaid Other

## 2018-05-02 DIAGNOSIS — M549 Dorsalgia, unspecified: Secondary | ICD-10-CM | POA: Diagnosis present

## 2018-05-02 DIAGNOSIS — Z888 Allergy status to other drugs, medicaments and biological substances status: Secondary | ICD-10-CM | POA: Diagnosis not present

## 2018-05-02 DIAGNOSIS — R1013 Epigastric pain: Secondary | ICD-10-CM

## 2018-05-02 DIAGNOSIS — F17210 Nicotine dependence, cigarettes, uncomplicated: Secondary | ICD-10-CM | POA: Diagnosis present

## 2018-05-02 DIAGNOSIS — K210 Gastro-esophageal reflux disease with esophagitis: Secondary | ICD-10-CM

## 2018-05-02 DIAGNOSIS — J45909 Unspecified asthma, uncomplicated: Secondary | ICD-10-CM | POA: Diagnosis present

## 2018-05-02 DIAGNOSIS — K6289 Other specified diseases of anus and rectum: Secondary | ICD-10-CM | POA: Diagnosis present

## 2018-05-02 DIAGNOSIS — D62 Acute posthemorrhagic anemia: Secondary | ICD-10-CM | POA: Diagnosis present

## 2018-05-02 DIAGNOSIS — K219 Gastro-esophageal reflux disease without esophagitis: Secondary | ICD-10-CM | POA: Diagnosis present

## 2018-05-02 DIAGNOSIS — F1910 Other psychoactive substance abuse, uncomplicated: Secondary | ICD-10-CM | POA: Diagnosis present

## 2018-05-02 DIAGNOSIS — Z886 Allergy status to analgesic agent status: Secondary | ICD-10-CM

## 2018-05-02 DIAGNOSIS — K922 Gastrointestinal hemorrhage, unspecified: Secondary | ICD-10-CM | POA: Diagnosis present

## 2018-05-02 DIAGNOSIS — Z96651 Presence of right artificial knee joint: Secondary | ICD-10-CM | POA: Diagnosis present

## 2018-05-02 DIAGNOSIS — F1410 Cocaine abuse, uncomplicated: Secondary | ICD-10-CM | POA: Diagnosis present

## 2018-05-02 DIAGNOSIS — G8929 Other chronic pain: Secondary | ICD-10-CM | POA: Diagnosis present

## 2018-05-02 DIAGNOSIS — K259 Gastric ulcer, unspecified as acute or chronic, without hemorrhage or perforation: Secondary | ICD-10-CM | POA: Diagnosis present

## 2018-05-02 DIAGNOSIS — M25512 Pain in left shoulder: Secondary | ICD-10-CM | POA: Diagnosis present

## 2018-05-02 DIAGNOSIS — Z91048 Other nonmedicinal substance allergy status: Secondary | ICD-10-CM

## 2018-05-02 DIAGNOSIS — K921 Melena: Secondary | ICD-10-CM | POA: Diagnosis present

## 2018-05-02 DIAGNOSIS — G9340 Encephalopathy, unspecified: Secondary | ICD-10-CM | POA: Diagnosis present

## 2018-05-02 DIAGNOSIS — Z79899 Other long term (current) drug therapy: Secondary | ICD-10-CM | POA: Diagnosis not present

## 2018-05-02 DIAGNOSIS — K633 Ulcer of intestine: Secondary | ICD-10-CM | POA: Diagnosis present

## 2018-05-02 DIAGNOSIS — T39395A Adverse effect of other nonsteroidal anti-inflammatory drugs [NSAID], initial encounter: Secondary | ICD-10-CM | POA: Diagnosis present

## 2018-05-02 DIAGNOSIS — K449 Diaphragmatic hernia without obstruction or gangrene: Secondary | ICD-10-CM | POA: Diagnosis present

## 2018-05-02 DIAGNOSIS — F1010 Alcohol abuse, uncomplicated: Secondary | ICD-10-CM | POA: Diagnosis present

## 2018-05-02 DIAGNOSIS — K2210 Ulcer of esophagus without bleeding: Secondary | ICD-10-CM | POA: Diagnosis present

## 2018-05-02 LAB — COMPREHENSIVE METABOLIC PANEL
ALT: 29 U/L (ref 17–63)
ANION GAP: 6 (ref 5–15)
AST: 28 U/L (ref 15–41)
Albumin: 3.1 g/dL — ABNORMAL LOW (ref 3.5–5.0)
Alkaline Phosphatase: 41 U/L (ref 38–126)
BUN: 15 mg/dL (ref 6–20)
CALCIUM: 8.7 mg/dL — AB (ref 8.9–10.3)
CHLORIDE: 106 mmol/L (ref 101–111)
CO2: 26 mmol/L (ref 22–32)
Creatinine, Ser: 0.8 mg/dL (ref 0.61–1.24)
GFR calc non Af Amer: >60 mL/min (ref >60)
Glucose, Bld: 93 mg/dL (ref 65–99)
Potassium: 4.6 mmol/L (ref 3.5–5.1)
SODIUM: 138 mmol/L (ref 135–145)
Total Bilirubin: 0.4 mg/dL (ref 0.3–1.2)
Total Protein: 6.4 g/dL — ABNORMAL LOW (ref 6.5–8.1)

## 2018-05-02 LAB — SAMPLE TO BLOOD BANK

## 2018-05-02 LAB — CBC
HCT: 29.3 % — ABNORMAL LOW (ref 39.0–52.0)
HEMOGLOBIN: 8.8 g/dL — AB (ref 13.0–17.0)
MCH: 24.2 pg — AB (ref 26.0–34.0)
MCHC: 30 g/dL (ref 30.0–36.0)
MCV: 80.7 fL (ref 78.0–100.0)
Platelets: 530 10*3/uL — ABNORMAL HIGH (ref 150–400)
RBC: 3.63 MIL/uL — AB (ref 4.22–5.81)
RDW: 15.1 % (ref 11.5–15.5)
WBC: 20.7 10*3/uL — ABNORMAL HIGH (ref 4.0–10.5)

## 2018-05-02 LAB — URINALYSIS, ROUTINE W REFLEX MICROSCOPIC
Bilirubin Urine: NEGATIVE
Glucose, UA: NEGATIVE mg/dL
HGB URINE DIPSTICK: NEGATIVE
Ketones, ur: NEGATIVE mg/dL
Leukocytes, UA: NEGATIVE
Nitrite: NEGATIVE
Protein, ur: NEGATIVE mg/dL
SPECIFIC GRAVITY, URINE: 1.02 (ref 1.005–1.030)
pH: 6 (ref 5.0–8.0)

## 2018-05-02 LAB — I-STAT TROPONIN, ED: Troponin i, poc: 0 ng/mL (ref 0.00–0.08)

## 2018-05-02 LAB — ABO/RH: ABO/RH(D): B NEG

## 2018-05-02 LAB — POC OCCULT BLOOD, ED: FECAL OCCULT BLD: POSITIVE — AB

## 2018-05-02 LAB — LIPASE, BLOOD: LIPASE: 33 U/L (ref 11–51)

## 2018-05-02 LAB — TYPE AND SCREEN
ABO/RH(D): B NEG
Antibody Screen: NEGATIVE

## 2018-05-02 MED ORDER — MORPHINE SULFATE (PF) 4 MG/ML IV SOLN
2.0000 mg | INTRAVENOUS | Status: DC | PRN
  Administered 2018-05-03 – 2018-05-05 (×12): 2 mg via INTRAVENOUS
  Filled 2018-05-02 (×12): qty 1

## 2018-05-02 MED ORDER — THIAMINE HCL 100 MG/ML IJ SOLN
Freq: Once | INTRAVENOUS | Status: AC
  Administered 2018-05-03: 01:00:00 via INTRAVENOUS
  Filled 2018-05-02: qty 1000

## 2018-05-02 MED ORDER — SODIUM CHLORIDE 0.9 % IV SOLN
8.0000 mg/h | INTRAVENOUS | Status: DC
  Administered 2018-05-03 – 2018-05-04 (×3): 8 mg/h via INTRAVENOUS
  Filled 2018-05-02 (×5): qty 80

## 2018-05-02 MED ORDER — ONDANSETRON HCL 4 MG/2ML IJ SOLN
4.0000 mg | Freq: Once | INTRAMUSCULAR | Status: AC
  Administered 2018-05-02: 4 mg via INTRAVENOUS
  Filled 2018-05-02: qty 2

## 2018-05-02 MED ORDER — PANTOPRAZOLE SODIUM 40 MG IV SOLR
40.0000 mg | Freq: Once | INTRAVENOUS | Status: AC
  Administered 2018-05-02: 40 mg via INTRAVENOUS
  Filled 2018-05-02: qty 40

## 2018-05-02 MED ORDER — SODIUM CHLORIDE 0.9 % IV BOLUS
1000.0000 mL | Freq: Once | INTRAVENOUS | Status: AC
  Administered 2018-05-02: 1000 mL via INTRAVENOUS

## 2018-05-02 MED ORDER — DIPHENHYDRAMINE HCL 50 MG/ML IJ SOLN
25.0000 mg | Freq: Once | INTRAMUSCULAR | Status: AC
  Administered 2018-05-02: 25 mg via INTRAVENOUS
  Filled 2018-05-02: qty 1

## 2018-05-02 MED ORDER — BUDESONIDE 0.25 MG/2ML IN SUSP: 0.2500 mg | Freq: Two times a day (BID) | RESPIRATORY_TRACT | Status: DC | PRN

## 2018-05-02 MED ORDER — IOHEXOL 300 MG/ML  SOLN
100.0000 mL | Freq: Once | INTRAMUSCULAR | Status: AC | PRN
  Administered 2018-05-02: 100 mL via INTRAVENOUS

## 2018-05-02 MED ORDER — FLUTICASONE PROPIONATE 50 MCG/ACT NA SUSP: 1.0000 | Freq: Every day | NASAL | Status: DC | PRN

## 2018-05-02 MED ORDER — ALBUTEROL SULFATE HFA 108 (90 BASE) MCG/ACT IN AERS: 1.0000 | INHALATION_SPRAY | Freq: Four times a day (QID) | RESPIRATORY_TRACT | Status: DC | PRN

## 2018-05-02 MED ORDER — SODIUM CHLORIDE 0.9 % IV SOLN
80.0000 mg | Freq: Once | INTRAVENOUS | Status: AC
  Administered 2018-05-02: 80 mg via INTRAVENOUS
  Filled 2018-05-02: qty 80

## 2018-05-02 MED ORDER — PIPERACILLIN-TAZOBACTAM 3.375 G IVPB
3.3750 g | Freq: Three times a day (TID) | INTRAVENOUS | Status: DC
  Administered 2018-05-02 – 2018-05-05 (×7): 3.375 g via INTRAVENOUS
  Filled 2018-05-02 (×9): qty 50

## 2018-05-02 MED ORDER — PANTOPRAZOLE SODIUM 40 MG IV SOLR: 40.0000 mg | Freq: Two times a day (BID) | INTRAVENOUS | Status: DC

## 2018-05-02 MED ORDER — PROCHLORPERAZINE EDISYLATE 10 MG/2ML IJ SOLN
10.0000 mg | Freq: Once | INTRAMUSCULAR | Status: AC
  Administered 2018-05-02: 10 mg via INTRAVENOUS
  Filled 2018-05-02: qty 2

## 2018-05-02 NOTE — H&P (Signed)
History and Physical    Samuel Barrett ZOX:[id number] DOB: Mar 30, 1971 DOA: 05/02/2018  Referring MD/NP/PA: Sharen Heck, PA PCP: Patient, No Pcp Per   Patient coming from: Home  Chief Complaint: abdominal pain  HPI: Samuel Barrett is a 47 y.o. male with medical history significant of polysubstance abuse especially cocaine and heroine also history of ulcerative spondylitis, gastritis and previous alcohol abuse who reported his last all cord intake about 6 months ago presenting with central abdominal pain for about a week. Pain is radiating to the epigastric area. He has noted bright red blood per rectum interspace was black stools for about a week. Patient also has had some nausea with vomiting including black emesis yesterday. He had a recent EGD on March 2019 at [facility]. He was placed on Protonix and Pepcid and asked to follow-up in the office. Patient continues to intermittent goodys powder intake. He continues to have heavy alcohol intake until 6 months ago. Not on any blood thinners. In the ER he was found to have grossly guaiac positive stool. Daughter my exam patient was droggy not staying awake to speak with me.   ED Course: patient was evaluated. He is tachycardic with a heart rate of 112, blood pressure 153 about 112. Urinalysis appears negative fecal O [place] blood test was positive. CT abdomen and pelvis showed mild mucosal thickening of ascending colon consistent with segmental colitis. Hemoglobin is 8.8 with a white count of 20.7 and platelet 5:30. Previous hemoglobin was 9.2 3 weeks ago. GI was consulted by ER and recommended inpatient admission  Review of Systems: As per HPI otherwise 10 point review of systems negative.    Past Medical History:  Diagnosis Date  . Anemia    hx of-  . Anemia   . Anxiety   . Arthritis   . Asthma   . Chronic back pain   . Cocaine abuse (HCC)   . GERD (gastroesophageal reflux disease)   . GI bleed   . History of blood transfusion   .  History of GI bleed   . History of kidney stones   . Kidney stone   . Opiate abuse, continuous (HCC)   . Pneumonia    last time 2015  . Pneumonia   . Pneumothorax 2000  . Psoriasis    "in the family"  . Subdural hematoma [facility])     Past Surgical History:  Procedure Laterality Date  . CARPAL TUNNEL RELEASE Right   . CARPAL TUNNEL RELEASE    . COLONOSCOPY WITH ESOPHAGOGASTRODUODENOSCOPY (EGD)    . JOINT REPLACEMENT Right    knee  . LEG SURGERY     left arthroscopy with screws   . ORIF CLAVICULAR FRACTURE Right 08/07/2016   Procedure: OPEN REDUCTION INTERNAL FIXATION (ORIF) RIGHT CLAVICLE FRACTURE;  Surgeon: Tarry Kos, MD;  Location: [facility] OR;  Service: Orthopedics;  Laterality: Right;  . ORIF CLAVICULAR FRACTURE Right 08/21/2016   Procedure: REVISION OPEN REDUCTION INTERNAL FIXATION (ORIF) RIGHT CLAVICLE FRACTURE AND OPEN REDUCTION INTERNAL FIXATION RIGHT OLECRANON;  Surgeon: Tarry Kos, MD;  Location: [facility] OR;  Service: Orthopedics;  Laterality: Right;  . ORIF CLAVICULAR FRACTURE    . ORIF ELBOW FRACTURE Right 08/21/2016   Procedure: OPEN REDUCTION INTERNAL FIXATION (ORIF) ELBOW/OLECRANON FRACTURE;  Surgeon: Tarry Kos, MD;  Location: [facility] OR;  Service: Orthopedics;  Laterality: Right;  . ORIF ELBOW FRACTURE    . TIBIA FRACTURE SURGERY       reports that he has been smoking  cigarettes.  He has never used smokeless tobacco. He reports that he drinks alcohol. He reports that he has current or past drug history. Drug: Cocaine.  Allergies  Allergen Reactions  . Ibuprofen Hives  . Ketorolac Tromethamine Hives  . Tape Rash    Reaction to some types of tape    No family history on file.   Prior to Admission medications   Medication Sig Start Date End Date Taking? Authorizing Provider  acetaminophen (TYLENOL) 500 MG tablet Take 1 tablet (500 mg total) by mouth every 6 (six) hours as needed for headache (pain). Patient taking differently: Take 1,000 mg by mouth every 6 (six) hours  as needed for headache (pain).  04/14/18  Yes Arrien, York Ram, MD  albuterol (PROVENTIL HFA;VENTOLIN HFA) 108 (90 Base) MCG/ACT inhaler Inhale 1-2 puffs into the lungs every 6 (six) hours as needed for wheezing. 01/31/17  Yes Garlon Hatchet, PA-C  beclomethasone (QVAR) 40 MCG/ACT inhaler Inhale 2 puffs into the lungs 2 (two) times daily as needed (shortness of breath/ wheezing).    Yes [provider]  fluticasone (FLONASE) 50 MCG/ACT nasal spray Place 1 spray into both nostrils daily as needed (congestion).    Yes [provider]  gabapentin (NEURONTIN) 300 MG capsule Take 900 mg by mouth 4 (four) times daily.   Yes [provider]  ipratropium (ATROVENT HFA) 17 MCG/ACT inhaler Inhale 2 puffs into the lungs every 6 (six) hours as needed for wheezing.   Yes [provider]  mirtazapine (REMERON) 15 MG tablet Take 45 mg by mouth at bedtime.    Yes [provider]  omeprazole (PRILOSEC) 20 MG capsule Take 40 mg by mouth 2 (two) times daily before a meal.   Yes [provider]  tiZANidine (ZANAFLEX) 4 MG tablet Take 4-8 mg by mouth every 6 (six) hours as needed for muscle spasms.    Yes [provider]  amLODipine (NORVASC) 5 MG tablet Take 5 mg by mouth daily.    [provider]  benzonatate (TESSALON) 100 MG capsule Take 1 capsule (100 mg total) by mouth every 8 (eight) hours. Patient not taking: Reported on 05/02/2018 01/31/17   Garlon Hatchet, PA-C  methocarbamol (ROBAXIN) 750 MG tablet Take 1 tablet (750 mg total) by mouth 2 (two) times daily as needed for muscle spasms. Patient not taking: Reported on 05/02/2018 08/21/16   Tarry Kos, MD  morphine (MSIR) 15 MG tablet Take 1 tablet (15 mg total) by mouth every 4 (four) hours as needed for severe pain. Patient not taking: Reported on 08/27/2016 08/01/16   Melene Plan, DO  oxyCODONE (OXYCONTIN) 10 mg 12 hr tablet Take 1 tablet (10 mg total) by mouth every 12 (twelve)  hours. Patient not taking: Reported on 08/27/2016 08/07/16   Tarry Kos, MD    Physical Exam: Vitals:   05/02/18 2000 05/02/18 2015 05/02/18 2115 05/02/18 2130  BP: (!) 155/88 (!) 143/103 (!) 154/99 (!) 159/99  Pulse: (!) 102 (!) 104 (!) 111 (!) 112  Resp: Temp:      SpO2: 98% 100% 96% 93%      Constitutional: NAD, calm, comfortable Vitals:   05/02/18 2000 05/02/18 2015 05/02/18 2115 05/02/18 2130  BP: (!) 155/88 (!) 143/103 (!) 154/99 (!) 159/99  Pulse: (!) 102 (!) 104 (!) 111 (!) 112  Resp: Temp:      SpO2: 98% 100% 96% 93%   Confuse sedated  after a dose of Dilaudid Eyes: PERRL, lids and conjunctivae normal ENMT: Mucous membranes are moist. Posterior pharynx clear of any exudate or lesions.Normal dentition.  Neck: normal, supple, no masses, no thyromegaly Respiratory: clear to auscultation bilaterally, no wheezing, no crackles. Normal respiratory effort. No accessory muscle use.  Cardiovascular: Regular rate and rhythm, no murmurs / rubs / gallops. No extremity edema. 2+ pedal pulses. No carotid bruits.  Abdomen: mildly distended, some tenderness, no masses palpated. No hepatosplenomegaly. Bowel sounds positive.  Musculoskeletal: no clubbing / cyanosis. No joint deformity upper and lower extremities. Good ROM, no contractures. Normal muscle tone.  Skin: no rashes, lesions, ulcers. No induration Neurologic: CN 2-12 grossly intact. Sensation intact, DTR normal. Strength 5/5 in all 4.  Psychiatric: Normal judgment and insight. Alert and oriented x 3. Normal mood.   Labs on Admission: I have personally reviewed following labs and imaging studies  CBC: Recent Labs  Lab 05/02/18 1600  WBC 20.7*  HGB 8.8*  HCT 29.3*  MCV 80.7  PLT 530*   Basic Metabolic Panel: Recent Labs  Lab 05/02/18 1600  NA 138  K 4.6  CL 106  CO2 26  GLUCOSE 93  BUN 15  CREATININE 0.80  CALCIUM 8.7*   GFR: CrCl cannot be calculated (Unknown ideal  weight.). Liver Function Tests: Recent Labs  Lab 05/02/18 1600  AST 28  ALT 29  ALKPHOS 41  BILITOT 0.4  PROT 6.4*  ALBUMIN 3.1*   Recent Labs  Lab 05/02/18 1600  LIPASE 33   No results for input(s): AMMONIA in the last 168 hours. Coagulation Profile: No results for input(s): INR, PROTIME in the last 168 hours. Cardiac Enzymes: No results for input(s): CKTOTAL, CKMB, CKMBINDEX, TROPONINI in the last 168 hours. BNP (last 3 results) No results for input(s): PROBNP in the last 8760 hours. HbA1C: No results for input(s): HGBA1C in the last 72 hours. CBG: No results for input(s): GLUCAP in the last 168 hours. Lipid Profile: No results for input(s): CHOL, HDL, LDLCALC, TRIG, CHOLHDL, LDLDIRECT in the last 72 hours. Thyroid Function Tests: No results for input(s): TSH, T4TOTAL, FREET4, T3FREE, THYROIDAB in the last 72 hours. Anemia Panel: No results for input(s): VITAMINB12, FOLATE, FERRITIN, TIBC, IRON, RETICCTPCT in the last 72 hours. Urine analysis:    Component Value Date/Time   COLORURINE YELLOW 05/02/2018 1620   APPEARANCEUR CLEAR 05/02/2018 1620   LABSPEC 1.020 05/02/2018 1620   PHURINE 6.0 05/02/2018 1620   GLUCOSEU NEGATIVE 05/02/2018 1620   HGBUR NEGATIVE 05/02/2018 1620   BILIRUBINUR NEGATIVE 05/02/2018 1620   KETONESUR NEGATIVE 05/02/2018 1620   PROTEINUR NEGATIVE 05/02/2018 1620   UROBILINOGEN 1.0 07/21/2015 0637   NITRITE NEGATIVE 05/02/2018 1620   LEUKOCYTESUR NEGATIVE 05/02/2018 1620   Sepsis Labs: (procalcitonin:4,lacticidven:4) )No results found for this or any previous visit (from the past 240 hour(s)).   Radiological Exams on Admission: Ct Abdomen Pelvis W Contrast  Result Date: 05/02/2018 CLINICAL DATA:  Upper abdominal pain.  Rectal bleeding. EXAM: CT ABDOMEN AND PELVIS WITH CONTRAST TECHNIQUE: Multidetector CT imaging of the abdomen and pelvis was performed using the standard protocol following bolus administration of intravenous  contrast. CONTRAST:  OMNIPAQUE IOHEXOL 300 MG/ML  SOLN COMPARISON:  None. FINDINGS: Lower chest: Lung bases are clear. Hepatobiliary: No focal hepatic lesion. No biliary duct dilatation. Gallbladder is normal. Common bile duct is normal. Pancreas: Pancreas is normal. No ductal dilatation. No pancreatic inflammation. Spleen: Normal spleen Adrenals/urinary tract: Adrenal glands and kidneys are normal. The ureters and bladder  normal. Stomach/Bowel: Stomach, small bowel, appendix, and cecum are normal. There is mild diffuse mucosal thickening in the ascending colon. No pericolonic inflammation. No diverticular disease this region. No discrete mass lesion. Finding best seen on sagittal image 21/7. The transverse and descending colon normal. Rectosigmoid colon appears normal. No pericolonic adenopathy Vascular/Lymphatic: Abdominal aorta is normal caliber. No periportal or retroperitoneal adenopathy. No pelvic adenopathy. Reproductive: Prostate normal Other: No free fluid. Musculoskeletal: No aggressive osseous lesion. IMPRESSION: 1. Mild mucosal thickening in the ascending colon is nonspecific. Finding could represent segmental colitis. 2. No additional abnormality of the bowel identified in otherwise normal scan. Electronically Signed   By: Genevive Bi M.D.   On: 05/02/2018 18:23    EKG: Independently reviewed. Normal sinus rhythm with a rate of 94. Nonspecific ST changes  Assessment/Plan Principal Problem:   GI bleed Active Problems:   GERD   Acute encephalopathy   Alcohol abuse    #1 lower GI bleed: Secondary to colitis. Patient also has history of alcohol abuse and gastritis. This could be upper GI bleed from esophagitis. We will initiate IV Protonix. Keep patient nothing by mouth. Consult GI for possible EGD and maybe colonoscopy. Treat colitis appropriately. Patient to be counseled against taking nonsteroidals again.   #2 segmental colitis: this may be responsible for patient's GI bleed.  His H&H has remained stable close to baseline from 3 weeks ago. Follow it serially every 6 hours. If low will transfuse. Meanwhile initiate IV Cipro and Flagyl  #3 acute encephalopathy: Combination of acute illness and medications. Patient is not awake   #4 GERD: Continue IV Protonix.  #5 history: Abuse: Patient has reported last alcohol intake 6 months ago. Monitor closely. If any sign of withdrawal we will initiate CIWA protocol.  #6 polysubstance abuse: Patient will need counseling. Consider urine drug screen.     DVT prophylaxis: SCD  Code Status: Full  Family Communication: None available and patient not awake   Disposition Plan: Home  Consults called: GI Dr Lonny Prude by ER Admission status: inpatient  Severity of Illness: The appropriate patient status for this patient is INPATIENT. Inpatient status is judged to be reasonable and necessary in order to provide the required intensity of service to ensure the patient's safety. The patient's presenting symptoms, physical exam findings, and initial radiographic and laboratory data in the context of their chronic comorbidities is felt to place them at high risk for further clinical deterioration. Furthermore, it is not anticipated that the patient will be medically stable for discharge from the hospital within 2 midnights of admission. The following factors support the patient status of inpatient.   " The patient's presenting symptoms include rectal bleed and abdominal pain. " The worrisome physical exam findings include abdominal tenderness and distention. " The initial radiographic and laboratory data are worrisome because of Y positive stools. 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   * I certify that at the point of admission it is my clinical judgment that the patient will require inpatient hospital care spanning beyond 2 midnights from the point of admission due to high intensity of service,  high risk for further deterioration and high frequency of surveillance required.Lonia Blood MD [facility] Pager [phone]  If 7PM-7AM, please contact night-coverage [email or web address] Password [email or web address]  05/02/2018, 10:30 PM

## 2018-05-02 NOTE — ED Notes (Signed)
Pt very uncomfortable, abdomen hard and distended. Pt complaint of nausea.

## 2018-05-02 NOTE — ED Provider Notes (Signed)
[facility] EMERGENCY DEPARTMENT Provider Note   CSN: [id number] Arrival date & time: 05/02/18  1549     History   Chief Complaint Chief Complaint  Patient presents with  . Abdominal Pain    HPI Samuel Barrett is a 47 y.o. male w/ h/o polysubstance abuse, GERD, ulcerative esophagitis, gastritis, acute blood loss anemia here for evaluation of epigastric abdominal pain, severe, constant x 1 week.  Associated with bright red then black diarrhea intermittent x 1 week and black emesis since today. Had EGD on 02/2018 by Dr. Donivan Scull [facility] health) , supposed to be on protonix and pepcid and f/u in office.  Intermittent goody powder use, not frequency. Heavy ETOH use as of 6 months ago, used to drink at least one 6 pack daily. No fevers, chills, CP, SOB, light-headedness, dysuria, hematuria. No previous abdominal surgeries. No blood thinners. Taking omeprazole daily.  HPI  Past Medical History:  Diagnosis Date  . Anemia    hx of-  . Anemia   . Anxiety   . Arthritis   . Asthma   . Chronic back pain   . Cocaine abuse (HCC)   . GERD (gastroesophageal reflux disease)   . GI bleed   . History of blood transfusion   . History of GI bleed   . History of kidney stones   . Kidney stone   . Opiate abuse, continuous (HCC)   . Pneumonia    last time 2015  . Pneumonia   . Pneumothorax 2000  . Psoriasis    "in the family"  . Subdural hematoma [facility])     Patient Active Problem List   Diagnosis Date Noted  . Acute metabolic encephalopathy 04/10/2018  . Aspiration pneumonia (HCC) 04/10/2018  . Polysubstance abuse (HCC) 04/10/2018  . Acute encephalopathy 04/10/2018  . Subdural hematoma (HCC)   . S/P ORIF (open reduction internal fixation) fracture 08/21/2016  . OD (overdose of drug) 07/21/2015  . Microcytic hypochromic anemia 07/21/2015  . Bipolar affective (HCC) 07/21/2015  . Abnormal EKG 07/21/2015  . Sedative, hypnotic or anxiolytic abuse, episodic (HCC)  07/21/2015  . Cocaine abuse with cocaine-induced mood disorder (HCC) 07/21/2015  . DEPRESSION 06/22/2009  . OSTEOARTHRITIS 08/23/2008  . WEIGHT LOSS 08/23/2008  . NEPHROLITHIASIS, HX OF 08/23/2008  . ASTHMA 08/12/2008  . GERD 08/12/2008  . KNEE PAIN, RIGHT 08/12/2008  . Other postprocedural status(V45.89) 08/12/2008  . 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 TIBIAL FRACTURE 12/30/1998    Past Surgical History:  Procedure Laterality Date  . CARPAL TUNNEL RELEASE Right   . CARPAL TUNNEL RELEASE    . COLONOSCOPY WITH ESOPHAGOGASTRODUODENOSCOPY (EGD)    . JOINT REPLACEMENT Right    knee  . LEG SURGERY     left arthroscopy with screws   . ORIF CLAVICULAR FRACTURE Right 08/07/2016   Procedure: OPEN REDUCTION INTERNAL FIXATION (ORIF) RIGHT CLAVICLE FRACTURE;  Surgeon: Tarry Kos, MD;  Location: [facility] OR;  Service: Orthopedics;  Laterality: Right;  . ORIF CLAVICULAR FRACTURE Right 08/21/2016   Procedure: REVISION OPEN REDUCTION INTERNAL FIXATION (ORIF) RIGHT CLAVICLE FRACTURE AND OPEN REDUCTION INTERNAL FIXATION RIGHT OLECRANON;  Surgeon: Tarry Kos, MD;  Location: [facility] OR;  Service: Orthopedics;  Laterality: Right;  . ORIF CLAVICULAR FRACTURE    . ORIF ELBOW FRACTURE Right 08/21/2016   Procedure: OPEN REDUCTION INTERNAL FIXATION (ORIF) ELBOW/OLECRANON FRACTURE;  Surgeon: Tarry Kos, MD;  Location: [facility] OR;  Service: Orthopedics;  Laterality: Right;  . ORIF ELBOW FRACTURE    .  TIBIA FRACTURE SURGERY          Home Medications    Prior to Admission medications   Medication Sig Start Date End Date Taking? Authorizing Provider  acetaminophen (TYLENOL) 500 MG tablet Take 1 tablet (500 mg total) by mouth every 6 (six) hours as needed for headache (pain). Patient taking differently: Take 1,000 mg by mouth every 6 (six) hours as needed for headache (pain).  04/14/18  Yes Arrien, York Ram, MD  albuterol (PROVENTIL HFA;VENTOLIN HFA) 108 (90 Base) MCG/ACT inhaler Inhale 1-2 puffs  into the lungs every 6 (six) hours as needed for wheezing. 01/31/17  Yes Garlon Hatchet, PA-C  beclomethasone (QVAR) 40 MCG/ACT inhaler Inhale 2 puffs into the lungs 2 (two) times daily as needed (shortness of breath/ wheezing).    Yes [provider]  fluticasone (FLONASE) 50 MCG/ACT nasal spray Place 1 spray into both nostrils daily as needed (congestion).    Yes [provider]  gabapentin (NEURONTIN) 300 MG capsule Take 900 mg by mouth 4 (four) times daily.   Yes [provider]  ipratropium (ATROVENT HFA) 17 MCG/ACT inhaler Inhale 2 puffs into the lungs every 6 (six) hours as needed for wheezing.   Yes [provider]  mirtazapine (REMERON) 15 MG tablet Take 45 mg by mouth at bedtime.    Yes [provider]  omeprazole (PRILOSEC) 20 MG capsule Take 40 mg by mouth 2 (two) times daily before a meal.   Yes [provider]  tiZANidine (ZANAFLEX) 4 MG tablet Take 4-8 mg by mouth every 6 (six) hours as needed for muscle spasms.    Yes [provider]  amLODipine (NORVASC) 5 MG tablet Take 5 mg by mouth daily.    [provider]  benzonatate (TESSALON) 100 MG capsule Take 1 capsule (100 mg total) by mouth every 8 (eight) hours. Patient not taking: Reported on 05/02/2018 01/31/17   Garlon Hatchet, PA-C  methocarbamol (ROBAXIN) 750 MG tablet Take 1 tablet (750 mg total) by mouth 2 (two) times daily as needed for muscle spasms. Patient not taking: Reported on 05/02/2018 08/21/16   Tarry Kos, MD  morphine (MSIR) 15 MG tablet Take 1 tablet (15 mg total) by mouth every 4 (four) hours as needed for severe pain. Patient not taking: Reported on 08/27/2016 08/01/16   Melene Plan, DO  oxyCODONE (OXYCONTIN) 10 mg 12 hr tablet Take 1 tablet (10 mg total) by mouth every 12 (twelve) hours. Patient not taking: Reported on 08/27/2016 08/07/16   Tarry Kos, MD    Family History No family history on file.  Social History Social History   Tobacco  Use  . Smoking status: Current Every Day Smoker    Types: Cigarettes  . Smokeless tobacco: Never Used  . Tobacco comment: .75 of a can in a day  Substance Use Topics  . Alcohol use: Yes    Comment: rarely  . Drug use: Yes    Types: Cocaine    Comment: heroin     Allergies   Ibuprofen; Ketorolac tromethamine; and Tape   Review of Systems Review of Systems  Gastrointestinal: Positive for abdominal pain, blood in stool, diarrhea, nausea and vomiting.       +hematemesis   All other systems reviewed and are negative.    Physical Exam Updated Vital Signs BP (!) 143/103   Pulse (!) 104   Temp 98.2 F (36.8 C)   Resp 18   SpO2 100%   Physical Exam  Constitutional: He is oriented to person, place, and time. He appears well-developed and well-nourished. No distress.  Mild distress, actively vomiting dark brown/black emesis.   HENT:  Head: Normocephalic and atraumatic.  Nose: Nose normal.  Mouth/Throat: No oropharyngeal exudate.  Moist mucous membranes   Eyes: Pupils are equal, round, and reactive to light. Conjunctivae and EOM are normal.  Neck: Normal range of motion.  Cardiovascular: Normal rate, regular rhythm, normal heart sounds and intact distal pulses.  No murmur heard. Palpated pulse HR 90. 2+ DP and radial pulses bilaterally. No LE edema.   Pulmonary/Chest: Effort normal and breath sounds normal.  Abdominal: Soft. Bowel sounds are normal. There is tenderness in the epigastric area and periumbilical area. There is guarding.  Mild distention. TTP to epigastric and periumbilical abdomen. Guarding noted. No suprapubic or CVAT. Negative Murphy's and Mcburney's/   Genitourinary:  Genitourinary Comments:  EMT present during exam Patient with no pain in perianal/rectal area with palpation.  There are no external fissures or external hemorrhoids noted.  No induration or swelling of the perianal skin.   Stool color is dark brown/green with no gross blood noted.   No  signs of perirectal abscess.   DRE reveals good sphincter tone.    Musculoskeletal: Normal range of motion. He exhibits no deformity.  Neurological: He is alert and oriented to person, place, and time.  Skin: Skin is warm and dry. Capillary refill takes less than 2 seconds.  Psychiatric: He has a normal mood and affect. His behavior is normal. Judgment and thought content normal.  Nursing note and vitals reviewed.    ED Treatments / Results  Labs (all labs ordered are listed, but only abnormal results are displayed) Labs Reviewed  COMPREHENSIVE METABOLIC PANEL - Abnormal; Notable for the following components:      Result Value   Calcium 8.7 (*)    Total Protein 6.4 (*)    Albumin 3.1 (*)    All other components within normal limits  CBC - Abnormal; Notable for the following components:   WBC 20.7 (*)    RBC 3.63 (*)    Hemoglobin 8.8 (*)    HCT 29.3 (*)    MCH 24.2 (*)    Platelets 530 (*)    All other components within normal limits  POC OCCULT BLOOD, ED - Abnormal; Notable for the following components:   Fecal Occult Bld POSITIVE (*)    All other components within normal limits  LIPASE, BLOOD  URINALYSIS, ROUTINE W REFLEX MICROSCOPIC  I-STAT TROPONIN, ED  POC OCCULT BLOOD, ED  SAMPLE TO BLOOD BANK  TYPE AND SCREEN    EKG None  Radiology Ct Abdomen Pelvis W Contrast  Result Date: 05/02/2018 CLINICAL DATA:  Upper abdominal pain.  Rectal bleeding. EXAM: CT ABDOMEN AND PELVIS WITH CONTRAST TECHNIQUE: Multidetector CT imaging of the abdomen and pelvis was performed using the standard protocol following bolus administration of intravenous contrast. CONTRAST:  OMNIPAQUE IOHEXOL 300 MG/ML  SOLN COMPARISON:  None. FINDINGS: Lower chest: Lung bases are clear. Hepatobiliary: No focal hepatic lesion. No biliary duct dilatation. Gallbladder is normal. Common bile duct is normal. Pancreas: Pancreas is normal. No ductal dilatation. No pancreatic inflammation. Spleen: Normal  spleen Adrenals/urinary tract: Adrenal glands and kidneys are normal. The ureters and bladder normal. Stomach/Bowel: Stomach, small bowel, appendix, and cecum are normal. There is mild diffuse mucosal thickening in the ascending colon. No pericolonic inflammation. No diverticular disease this region. No discrete mass lesion. Finding best seen on sagittal  image 21/7. The transverse and descending colon normal. Rectosigmoid colon appears normal. No pericolonic adenopathy Vascular/Lymphatic: Abdominal aorta is normal caliber. No periportal or retroperitoneal adenopathy. No pelvic adenopathy. Reproductive: Prostate normal Other: No free fluid. Musculoskeletal: No aggressive osseous lesion. IMPRESSION: 1. Mild mucosal thickening in the ascending colon is nonspecific. Finding could represent segmental colitis. 2. No additional abnormality of the bowel identified in otherwise normal scan. Electronically Signed   By: Genevive Bi M.D.   On: 05/02/2018 18:23    Procedures Procedures (including critical care time)  Medications Ordered in ED Medications  ondansetron (ZOFRAN) injection 4 mg (4 mg Intravenous Given 05/02/18 1653)  pantoprazole (PROTONIX) injection 40 mg (40 mg Intravenous Given 05/02/18 1736)  sodium chloride 0.9 % bolus 1,000 mL (0 mLs Intravenous Stopped 05/02/18 1908)  iohexol (OMNIPAQUE) 300 MG/ML solution 100 mL (100 mLs Intravenous Contrast Given 05/02/18 1754)  ondansetron (ZOFRAN) injection 4 mg (4 mg Intravenous Given 05/02/18 1836)  prochlorperazine (COMPAZINE) injection 10 mg (10 mg Intravenous Given 05/02/18 1854)  diphenhydrAMINE (BENADRYL) injection 25 mg (25 mg Intravenous Given 05/02/18 1854)     Initial Impression / Assessment and Plan / ED Course  I have reviewed the triage vital signs and the nursing notes.  Pertinent labs & imaging results that were available during my care of the patient were reviewed by me and considered in my medical decision making (see chart for  details).  Clinical Course as of May 03 2055  Sat May 02, 2018  1711 WBC(!): 20.7 [CG]  1711 Hemoglobin(!): 8.8 [CG]  1844 IMPRESSION: 1. Mild mucosal thickening in the ascending colon is nonspecific. Finding could represent segmental colitis. 2. No additional abnormality of the bowel identified in otherwise normal scan.   Electronically Signed By: Genevive Bi M.D. On: 05/02/2018 18:23    CT ABDOMEN PELVIS W CONTRAST [CG]  1922 Hemoccult performed. Pt reports improved nausea, vomiting. Resting now. Has no complaints at this time.    [CG]  1923 Fecal Occult Blood, POC(!): POSITIVE [CG]  2003 Main lab notified me emesis positive for blood    [CG]  2041 Spoke to GI (Brahmbhatt) who recommend protonix BID, abx, trend Hh. Will likely need EGB in hospital.    [CG]    Clinical Course User Index [CG] Liberty Handy, PA-C   ddx of GI bleed could upper or lower source at this point, has h/o ulcerative esophagitis in setting of ETOH use.  Given bloody diarrhea also considering diverticulitis.  SBO and perforation on differential as well.    Lab work remarkable for WBC 20.7, hgb 8.8.  Baseline hgb this year range 8.8-9.2, last year in 10s. Will order IVF, zofran protonix, fentanyl for pain.  Patient on cardiac monitor.   2045: CT abdomen unremarkable for colitis.  Emesis and stool samples positive for blood.  Patient has required 3 antiemetics with some relief of nausea/vomiting.  We have been cautious with opioid medication in the ER due to polysubstance abuse.  He has remained tachycardic in the ER.  Given hemoglobin, hematemesis, bloody stools, severe ulcerative esophagitis noted on EGD last month, feel admission is appropriate.  He has required blood transfusions in the past. Patient will need trend of H/H and serial abdominal exams.  On my exam, he does have moderate epigastric/periumbilical tenderness with some guarding.  I spoke to GI agrees with admission, also recommends  Protonix twice daily, antibiotics likely need EGD in the hospital.  Will consult hospitalist for admission. Zosyn ordere per pharmacy.  Final  Clinical Impressions(s) / ED Diagnoses   Final diagnoses:  Epigastric pain    ED Discharge Orders    None       Jerrell Mylar 05/02/18 2056    Arby Barrette, MD 05/03/18 [phone]

## 2018-05-02 NOTE — Progress Notes (Addendum)
Pharmacy Antibiotic Note  Samuel Barrett is a 47 y.o. male admitted on 05/02/2018 with dark tarry stools and generalized abdominal pain,. Recent hospital admission noted.   Pharmacy has been consulted for Zosyn dosing for intra-abdominal infection/colitis.  Plan: Zosyn 3.375 gm IV q8 hour (4 hour infusions).       Temp (24hrs), Avg:98.2 F (36.8 C), Min:98.2 F (36.8 C), Max:98.2 F (36.8 C)  Recent Labs  Lab 05/02/18 1600  WBC 20.7*  CREATININE 0.80    CrCl cannot be calculated (Unknown ideal weight.).    Allergies  Allergen Reactions  . Ibuprofen Hives  . Ketorolac Tromethamine Hives  . Tape Rash    Reaction to some types of tape    Antimicrobials this admission: Zosyn 5/4>>  Dose adjustments this admission:   Microbiology results: No cultures ordered  Thank you for allowing pharmacy to be a part of this patient's care.  Noah Delaine, RPh Clinical Pharmacist Main pharmacy [phone] 05/02/2018 8:53 PM

## 2018-05-02 NOTE — ED Triage Notes (Addendum)
Pt with recent hospital admission. Pt was brought in by EMS today for c.o. Dark tarry stools with generalized abdominal pain. Originally went to [facility] for BP check because he thought he was hypotensive. Pt has had his symptoms for about 4 hours.

## 2018-05-02 NOTE — ED Triage Notes (Signed)
PT  Reports he has not used any Alcohol in 6 months.

## 2018-05-03 ENCOUNTER — Other Ambulatory Visit: Payer: Self-pay

## 2018-05-03 ENCOUNTER — Encounter (HOSPITAL_COMMUNITY): Payer: Self-pay | Admitting: *Deleted

## 2018-05-03 LAB — CBC
HCT: 30.4 % — ABNORMAL LOW (ref 39.0–52.0)
HCT: 27.9 % — ABNORMAL LOW (ref 39.0–52.0)
HCT: 25.4 % — ABNORMAL LOW (ref 39.0–52.0)
HEMATOCRIT: 30.1 % — AB (ref 39.0–52.0)
Hemoglobin: 9.3 g/dL — ABNORMAL LOW (ref 13.0–17.0)
Hemoglobin: 9 g/dL — ABNORMAL LOW (ref 13.0–17.0)
Hemoglobin: 8.5 g/dL — ABNORMAL LOW (ref 13.0–17.0)
Hemoglobin: 7.6 g/dL — ABNORMAL LOW (ref 13.0–17.0)
MCH: 23.9 pg — ABNORMAL LOW (ref 26.0–34.0)
MCH: 23.7 pg — ABNORMAL LOW (ref 26.0–34.0)
MCH: 24.3 pg — AB (ref 26.0–34.0)
MCH: 24.3 pg — AB (ref 26.0–34.0)
MCHC: 30.6 g/dL (ref 30.0–36.0)
MCHC: 29.9 g/dL — ABNORMAL LOW (ref 30.0–36.0)
MCHC: 29.9 g/dL — AB (ref 30.0–36.0)
MCHC: 30.5 g/dL (ref 30.0–36.0)
MCV: 79.7 fL (ref 78.0–100.0)
MCV: 79.1 fL (ref 78.0–100.0)
MCV: 79.6 fL (ref 78.0–100.0)
MCV: 80.1 fL (ref 78.0–100.0)
PLATELETS: 442 10*3/uL — AB (ref 150–400)
PLATELETS: 443 10*3/uL — AB (ref 150–400)
Platelets: 532 10*3/uL — ABNORMAL HIGH (ref 150–400)
Platelets: 550 10*3/uL — ABNORMAL HIGH (ref 150–400)
RBC: 3.76 MIL/uL — ABNORMAL LOW (ref 4.22–5.81)
RBC: 3.5 MIL/uL — ABNORMAL LOW (ref 4.22–5.81)
RBC: 3.21 MIL/uL — ABNORMAL LOW (ref 4.22–5.81)
RBC: 3.82 MIL/uL — AB (ref 4.22–5.81)
RDW: 15.1 % (ref 11.5–15.5)
RDW: 15.3 % (ref 11.5–15.5)
RDW: 15.3 % (ref 11.5–15.5)
RDW: 15.8 % — AB (ref 11.5–15.5)
WBC: 21.3 10*3/uL — ABNORMAL HIGH (ref 4.0–10.5)
WBC: 19.4 10*3/uL — ABNORMAL HIGH (ref 4.0–10.5)
WBC: 16.8 10*3/uL — ABNORMAL HIGH (ref 4.0–10.5)
WBC: 12.9 10*3/uL — ABNORMAL HIGH (ref 4.0–10.5)

## 2018-05-03 LAB — COMPREHENSIVE METABOLIC PANEL
ALBUMIN: 2.9 g/dL — AB (ref 3.5–5.0)
ALT: 25 U/L (ref 17–63)
AST: 19 U/L (ref 15–41)
Alkaline Phosphatase: 45 U/L (ref 38–126)
Anion gap: 8 (ref 5–15)
BUN: 7 mg/dL (ref 6–20)
CHLORIDE: 107 mmol/L (ref 101–111)
CO2: 26 mmol/L (ref 22–32)
CREATININE: 0.91 mg/dL (ref 0.61–1.24)
Calcium: 8.7 mg/dL — ABNORMAL LOW (ref 8.9–10.3)
GFR calc Af Amer: >60 mL/min (ref >60)
GFR calc non Af Amer: >60 mL/min (ref >60)
Glucose, Bld: 92 mg/dL (ref 65–99)
Potassium: 4.3 mmol/L (ref 3.5–5.1)
SODIUM: 141 mmol/L (ref 135–145)
Total Bilirubin: 0.5 mg/dL (ref 0.3–1.2)
Total Protein: 6.7 g/dL (ref 6.5–8.1)

## 2018-05-03 MED ORDER — ONDANSETRON HCL 4 MG/2ML IJ SOLN: 4.0000 mg | Freq: Four times a day (QID) | INTRAMUSCULAR | Status: DC | PRN

## 2018-05-03 MED ORDER — LORAZEPAM 2 MG/ML IJ SOLN
1.0000 mg | Freq: Four times a day (QID) | INTRAMUSCULAR | Status: DC | PRN
  Administered 2018-05-04: 1 mg via INTRAVENOUS
  Filled 2018-05-03: qty 1

## 2018-05-03 MED ORDER — ALBUTEROL SULFATE (2.5 MG/3ML) 0.083% IN NEBU: 3.0000 mL | INHALATION_SOLUTION | Freq: Four times a day (QID) | RESPIRATORY_TRACT | Status: DC | PRN

## 2018-05-03 MED ORDER — GI COCKTAIL ~~LOC~~
30.0000 mL | Freq: Two times a day (BID) | ORAL | Status: DC | PRN
  Administered 2018-05-03: 30 mL via ORAL
  Filled 2018-05-03: qty 30

## 2018-05-03 MED ORDER — THIAMINE HCL 100 MG/ML IJ SOLN
Freq: Once | INTRAVENOUS | Status: AC
  Administered 2018-05-03: 07:00:00 via INTRAVENOUS
  Filled 2018-05-03: qty 1000

## 2018-05-03 MED ORDER — LIDOCAINE 5 % EX PTCH
2.0000 | MEDICATED_PATCH | Freq: Every morning | CUTANEOUS | Status: DC
  Administered 2018-05-03 – 2018-05-05 (×3): 2 via TRANSDERMAL
  Filled 2018-05-03 (×3): qty 2

## 2018-05-03 NOTE — Progress Notes (Signed)
New Admission Note:  Arrival Method: By bed from ED around 0500.  Mental Orientation: Alert and oriented Telemetry: CCMD notified Assessment: Completed Skin: Completed, refer to flowsheets IV: Left AC Pain: Abdominal pain, already given morphine Tubes: None Safety Measures: Safety Fall Prevention Plan was given, discussed  Admission: Completed [facility] Orientation: Patient has been orientated to the room, unit and the staff. Family: None  Orders have been reviewed and implemented. Will continue to monitor the patient. Call light has been placed within reach.  Alfonse Ras, RN  Phone Number: [phone]

## 2018-05-03 NOTE — H&P (View-Only) (Signed)
Referring Provider:  ER Primary Care Physician:  Patient, No Pcp Per Primary Gastroenterologist: unassigned  Reason for Consultation:  Abdominal pain, melena  HPI: Samuel Barrett is a 47 y.o. male with past medical history of polysubstance use,cocaine abuse, history of esophagitis presented to the hospital with abdominal pain and melena. Patient was recently discharged  ( 04/14/2018)after being treated for altered mental status probably from drug overdose complicated by aspiration pneumonia.  Patient seen and examined at bedside. Patient with intermittent nausea, vomiting and abdominal pain. Started noticing black color stool along with bright red blood per rectum 2 days ago. Associated with epigastric abdominal pain. Had 1 episode of coffee-ground emesis. Denies any dysphagia or odynophagia. Had colonoscopy in the past several years ago.  Initial evaluation showed relatively stable hemoglobin. CT abdomen pelvis showed segmental thickening of ascending colon.  Patient was admitted to [facility] for hematemesis in March. Underwent EGD on 03/20/2018 which showed severe distal esophagitis and ulcer at GE junction.  Past Medical History:  Diagnosis Date  . Anemia    hx of-  . Anemia   . Anxiety   . Arthritis   . Asthma   . Chronic back pain   . Cocaine abuse (HCC)   . GERD (gastroesophageal reflux disease)   . GI bleed   . History of blood transfusion   . History of GI bleed   . History of kidney stones   . Kidney stone   . Opiate abuse, continuous (HCC)   . Pneumonia    last time 2015  . Pneumonia   . Pneumothorax 2000  . Psoriasis    "in the family"  . Subdural hematoma (HCC)     Past Surgical History:  Procedure Laterality Date  . CARPAL TUNNEL RELEASE Right   . CARPAL TUNNEL RELEASE    . COLONOSCOPY WITH ESOPHAGOGASTRODUODENOSCOPY (EGD)    . JOINT REPLACEMENT Right    knee  . LEG SURGERY     left arthroscopy with screws   . ORIF CLAVICULAR FRACTURE Right 08/07/2016    Procedure: OPEN REDUCTION INTERNAL FIXATION (ORIF) RIGHT CLAVICLE FRACTURE;  Surgeon: Naiping M Xu, MD;  Location: [facility] OR;  Service: Orthopedics;  Laterality: Right;  . ORIF CLAVICULAR FRACTURE Right 08/21/2016   Procedure: REVISION OPEN REDUCTION INTERNAL FIXATION (ORIF) RIGHT CLAVICLE FRACTURE AND OPEN REDUCTION INTERNAL FIXATION RIGHT OLECRANON;  Surgeon: Naiping M Xu, MD;  Location: [facility] OR;  Service: Orthopedics;  Laterality: Right;  . ORIF CLAVICULAR FRACTURE    . ORIF ELBOW FRACTURE Right 08/21/2016   Procedure: OPEN REDUCTION INTERNAL FIXATION (ORIF) ELBOW/OLECRANON FRACTURE;  Surgeon: Naiping M Xu, MD;  Location: [facility] OR;  Service: Orthopedics;  Laterality: Right;  . ORIF ELBOW FRACTURE    . TIBIA FRACTURE SURGERY      Prior to Admission medications   Medication Sig Start Date End Date Taking? Authorizing Provider  acetaminophen (TYLENOL) 500 MG tablet Take 1 tablet (500 mg total) by mouth every 6 (six) hours as needed for headache (pain). Patient taking differently: Take 1,000 mg by mouth every 6 (six) hours as needed for headache (pain).  04/14/18  Yes Arrien, Mauricio Daniel, MD  albuterol (PROVENTIL HFA;VENTOLIN HFA) 108 (90 Base) MCG/ACT inhaler Inhale 1-2 puffs into the lungs every 6 (six) hours as needed for wheezing. 01/31/17  Yes Sanders, Lisa M, PA-C  beclomethasone (QVAR) 40 MCG/ACT inhaler Inhale 2 puffs into the lungs 2 (two) times daily as needed (shortness of breath/ wheezing).    Yes [provider]    fluticasone (FLONASE) 50 MCG/ACT nasal spray Place 1 spray into both nostrils daily as needed (congestion).    Yes [provider]  gabapentin (NEURONTIN) 300 MG capsule Take 900 mg by mouth 4 (four) times daily.   Yes [provider]  ipratropium (ATROVENT HFA) 17 MCG/ACT inhaler Inhale 2 puffs into the lungs every 6 (six) hours as needed for wheezing.   Yes [provider]  mirtazapine (REMERON) 15 MG tablet Take 45 mg by mouth at bedtime.    Yes  [provider]  omeprazole (PRILOSEC) 20 MG capsule Take 40 mg by mouth 2 (two) times daily before a meal.   Yes [provider]  tiZANidine (ZANAFLEX) 4 MG tablet Take 4-8 mg by mouth every 6 (six) hours as needed for muscle spasms.    Yes [provider]  amLODipine (NORVASC) 5 MG tablet Take 5 mg by mouth daily.    [provider]  benzonatate (TESSALON) 100 MG capsule Take 1 capsule (100 mg total) by mouth every 8 (eight) hours. Patient not taking: Reported on 05/02/2018 01/31/17   Sanders, Lisa M, PA-C  methocarbamol (ROBAXIN) 750 MG tablet Take 1 tablet (750 mg total) by mouth 2 (two) times daily as needed for muscle spasms. Patient not taking: Reported on 05/02/2018 08/21/16   Xu, Naiping M, MD  morphine (MSIR) 15 MG tablet Take 1 tablet (15 mg total) by mouth every 4 (four) hours as needed for severe pain. Patient not taking: Reported on 08/27/2016 08/01/16   Floyd, Dan, DO  oxyCODONE (OXYCONTIN) 10 mg 12 hr tablet Take 1 tablet (10 mg total) by mouth every 12 (twelve) hours. Patient not taking: Reported on 08/27/2016 08/07/16   Xu, Naiping M, MD    Scheduled Meds: . [START ON 05/06/2018] pantoprazole  40 mg Intravenous Q12H   Continuous Infusions: . pantoprozole (PROTONIX) infusion 8 mg/hr (05/03/18 0311)  . piperacillin-tazobactam (ZOSYN)  IV Stopped (05/03/18 0237)   PRN Meds:.albuterol, budesonide, fluticasone, LORazepam, morphine injection, ondansetron (ZOFRAN) IV  Allergies as of 05/02/2018 - Review Complete 05/02/2018  Allergen Reaction Noted  . Ibuprofen Hives 08/12/2008  . Ketorolac tromethamine Hives 08/12/2008  . Tape Rash 04/10/2018    No family history on file.  Social History   Socioeconomic History  . Marital status: Single    Spouse name: Not on file  . Number of children: Not on file  . Years of education: Not on file  . Highest education level: Not on file  Occupational History  . Not on file  Social Needs  . Financial  resource strain: Not on file  . Food insecurity:    Worry: Not on file    Inability: Not on file  . Transportation needs:    Medical: Not on file    Non-medical: Not on file  Tobacco Use  . Smoking status: Current Every Day Smoker    Types: Cigarettes  . Smokeless tobacco: Never Used  . Tobacco comment: .75 of a can in a day  Substance and Sexual Activity  . Alcohol use: Yes    Comment: rarely  . Drug use: Yes    Types: Cocaine    Comment: heroin  . Sexual activity: Not on file  Lifestyle  . Physical activity:    Days per week: Not on file    Minutes per session: Not on file  . Stress: Not on file  Relationships  . Social connections:    Talks on phone: Not on file    Gets   together: Not on file    Attends religious service: Not on file    Active member of club or organization: Not on file    Attends meetings of clubs or organizations: Not on file    Relationship status: Not on file  . Intimate partner violence:    Fear of current or ex partner: Not on file    Emotionally abused: Not on file    Physically abused: Not on file    Forced sexual activity: Not on file  Other Topics Concern  . Not on file  Social History Narrative   ** Merged History Encounter **        Review of Systems: Review of Systems  Constitutional: Positive for malaise/fatigue. Negative for chills and fever.  HENT: Negative for hearing loss and tinnitus.   Eyes: Negative for blurred vision and double vision.  Respiratory: Positive for cough and sputum production. Negative for hemoptysis.   Cardiovascular: Negative for chest pain and palpitations.  Gastrointestinal: Positive for abdominal pain, blood in stool, diarrhea, heartburn, melena, nausea and vomiting.  Genitourinary: Negative for dysuria and urgency.  Musculoskeletal: Positive for back pain and myalgias.  Skin: Negative for itching and rash.  Neurological: Negative for seizures and loss of consciousness.  Endo/Heme/Allergies: Does not  bruise/bleed easily.  Psychiatric/Behavioral: Positive for substance abuse. Negative for suicidal ideas.    Physical Exam: Vital signs: Vitals:   05/03/18 0430 05/03/18 0517  BP: (!) 135/96 (!) 151/92  Pulse:  (!) 111  Resp: (!) 21 16  Temp:  99.3 F (37.4 C)  SpO2:  99%   Last BM Date: 05/02/18 Physical Exam  Constitutional: He is oriented to person, place, and time. He appears well-developed and well-nourished. No distress.  HENT:  Head: Normocephalic and atraumatic.  Mouth/Throat: Oropharynx is clear and moist. No oropharyngeal exudate.  Poor dentition  Eyes: EOM are normal. No scleral icterus.  Neck: Normal range of motion. Neck supple.  Cardiovascular: Normal rate, regular rhythm and normal heart sounds.  Pulmonary/Chest: Effort normal. No respiratory distress.  Bilateral rhonchi  Abdominal: Soft. Bowel sounds are normal. He exhibits no distension. There is tenderness. There is no rebound and no guarding.  Epigastric tenderness to palpation  Musculoskeletal: Normal range of motion. He exhibits no edema.  Neurological: He is alert and oriented to person, place, and time.  Skin: Skin is warm. No erythema.  Psychiatric: He has a normal mood and affect. Judgment normal.  Vitals reviewed.   GI:  Lab Results: Recent Labs    05/02/18 1600 05/03/18 0030 05/03/18 0351  WBC 20.7* 21.3* 19.4*  HGB 8.8* 9.3* 9.0*  HCT 29.3* 30.4* 30.1*  PLT 530* 532* 550*   BMET Recent Labs    05/02/18 1600 05/03/18 0351  NA 138 141  K 4.6 4.3  CL 106 107  CO2 26 26  GLUCOSE 93 92  BUN 15 7  CREATININE 0.80 0.91  CALCIUM 8.7* 8.7*   LFT Recent Labs    05/03/18 0351  PROT 6.7  ALBUMIN 2.9*  AST 19  ALT 25  ALKPHOS 45  BILITOT 0.5   PT/INR No results for input(s): LABPROT, INR in the last 72 hours.   Studies/Results: Ct Abdomen Pelvis W Contrast  Result Date: 05/02/2018 CLINICAL DATA:  Upper abdominal pain.  Rectal bleeding. EXAM: CT ABDOMEN AND PELVIS WITH  CONTRAST TECHNIQUE: Multidetector CT imaging of the abdomen and pelvis was performed using the standard protocol following bolus administration of intravenous contrast. CONTRAST:  100mL OMNIPAQUE IOHEXOL   300 MG/ML  SOLN COMPARISON:  None. FINDINGS: Lower chest: Lung bases are clear. Hepatobiliary: No focal hepatic lesion. No biliary duct dilatation. Gallbladder is normal. Common bile duct is normal. Pancreas: Pancreas is normal. No ductal dilatation. No pancreatic inflammation. Spleen: Normal spleen Adrenals/urinary tract: Adrenal glands and kidneys are normal. The ureters and bladder normal. Stomach/Bowel: Stomach, small bowel, appendix, and cecum are normal. There is mild diffuse mucosal thickening in the ascending colon. No pericolonic inflammation. No diverticular disease this region. No discrete mass lesion. Finding best seen on sagittal image 21/7. The transverse and descending colon normal. Rectosigmoid colon appears normal. No pericolonic adenopathy Vascular/Lymphatic: Abdominal aorta is normal caliber. No periportal or retroperitoneal adenopathy. No pelvic adenopathy. Reproductive: Prostate normal Other: No free fluid. Musculoskeletal: No aggressive osseous lesion. IMPRESSION: 1. Mild mucosal thickening in the ascending colon is nonspecific. Finding could represent segmental colitis. 2. No additional abnormality of the bowel identified in otherwise normal scan. Electronically Signed   By: Stewart  Edmunds M.D.   On: 05/02/2018 18:23    Impression/Plan: - coffee-ground emesis and melena. Patient with history of ulcerative esophagitis is done EGD done at  [facility] on 03/20/2018. - epigastric abdominal pain. Improving - Rectal bleeding. One episode of bright red blood per rectum. CT scan showing segmental wall thickening of ascending colon. - Substance abuse including cocaine abuse.  Recommendations ----------------------- - EGD tomorrow for further evaluation. Risk, benefits, alternatives  discussed with the patient. He verbalized understanding.continue PPI. - May need inpatient colonoscopy. - Okay to have full liquid diet today. Nothing by mouth past midnight. Monitor H&H. GI will follow   LOS: 1 day   Jamiyla Ishee  MD, FACP 05/03/2018, 8:50 AM  Contact #  [phone] 

## 2018-05-03 NOTE — Consult Note (Signed)
Referring Provider:  ER Primary Care Physician:  Patient, No Pcp Per Primary Gastroenterologist: unassigned  Reason for Consultation:  Abdominal pain, melena  HPI: Samuel Barrett is a 47 y.o. male with past medical history of polysubstance use,cocaine abuse, history of esophagitis presented to the hospital with abdominal pain and melena. Patient was recently discharged  ( 04/14/2018)after being treated for altered mental status probably from drug overdose complicated by aspiration pneumonia.  Patient seen and examined at bedside. Patient with intermittent nausea, vomiting and abdominal pain. Started noticing black color stool along with bright red blood per rectum 2 days ago. Associated with epigastric abdominal pain. Had 1 episode of coffee-ground emesis. Denies any dysphagia or odynophagia. Had colonoscopy in the past several years ago.  Initial evaluation showed relatively stable hemoglobin. CT abdomen pelvis showed segmental thickening of ascending colon.  Patient was admitted to [facility] for hematemesis in March. Underwent EGD on 03/20/2018 which showed severe distal esophagitis and ulcer at GE junction.  Past Medical History:  Diagnosis Date  . Anemia    hx of-  . Anemia   . Anxiety   . Arthritis   . Asthma   . Chronic back pain   . Cocaine abuse (HCC)   . GERD (gastroesophageal reflux disease)   . GI bleed   . History of blood transfusion   . History of GI bleed   . History of kidney stones   . Kidney stone   . Opiate abuse, continuous (HCC)   . Pneumonia    last time 2015  . Pneumonia   . Pneumothorax 2000  . Psoriasis    "in the family"  . Subdural hematoma [facility])     Past Surgical History:  Procedure Laterality Date  . CARPAL TUNNEL RELEASE Right   . CARPAL TUNNEL RELEASE    . COLONOSCOPY WITH ESOPHAGOGASTRODUODENOSCOPY (EGD)    . JOINT REPLACEMENT Right    knee  . LEG SURGERY     left arthroscopy with screws   . ORIF CLAVICULAR FRACTURE Right 08/07/2016    Procedure: OPEN REDUCTION INTERNAL FIXATION (ORIF) RIGHT CLAVICLE FRACTURE;  Surgeon: Tarry Kos, MD;  Location: [facility] OR;  Service: Orthopedics;  Laterality: Right;  . ORIF CLAVICULAR FRACTURE Right 08/21/2016   Procedure: REVISION OPEN REDUCTION INTERNAL FIXATION (ORIF) RIGHT CLAVICLE FRACTURE AND OPEN REDUCTION INTERNAL FIXATION RIGHT OLECRANON;  Surgeon: Tarry Kos, MD;  Location: [facility] OR;  Service: Orthopedics;  Laterality: Right;  . ORIF CLAVICULAR FRACTURE    . ORIF ELBOW FRACTURE Right 08/21/2016   Procedure: OPEN REDUCTION INTERNAL FIXATION (ORIF) ELBOW/OLECRANON FRACTURE;  Surgeon: Tarry Kos, MD;  Location: [facility] OR;  Service: Orthopedics;  Laterality: Right;  . ORIF ELBOW FRACTURE    . TIBIA FRACTURE SURGERY      Prior to Admission medications   Medication Sig Start Date End Date Taking? Authorizing Provider  acetaminophen (TYLENOL) 500 MG tablet Take 1 tablet (500 mg total) by mouth every 6 (six) hours as needed for headache (pain). Patient taking differently: Take 1,000 mg by mouth every 6 (six) hours as needed for headache (pain).  04/14/18  Yes Arrien, York Ram, MD  albuterol (PROVENTIL HFA;VENTOLIN HFA) 108 (90 Base) MCG/ACT inhaler Inhale 1-2 puffs into the lungs every 6 (six) hours as needed for wheezing. 01/31/17  Yes Garlon Hatchet, PA-C  beclomethasone (QVAR) 40 MCG/ACT inhaler Inhale 2 puffs into the lungs 2 (two) times daily as needed (shortness of breath/ wheezing).    Yes [provider]  fluticasone (FLONASE) 50 MCG/ACT nasal spray Place 1 spray into both nostrils daily as needed (congestion).    Yes [provider]  gabapentin (NEURONTIN) 300 MG capsule Take 900 mg by mouth 4 (four) times daily.   Yes [provider]  ipratropium (ATROVENT HFA) 17 MCG/ACT inhaler Inhale 2 puffs into the lungs every 6 (six) hours as needed for wheezing.   Yes [provider]  mirtazapine (REMERON) 15 MG tablet Take 45 mg by mouth at bedtime.    Yes  [provider]  omeprazole (PRILOSEC) 20 MG capsule Take 40 mg by mouth 2 (two) times daily before a meal.   Yes [provider]  tiZANidine (ZANAFLEX) 4 MG tablet Take 4-8 mg by mouth every 6 (six) hours as needed for muscle spasms.    Yes [provider]  amLODipine (NORVASC) 5 MG tablet Take 5 mg by mouth daily.    [provider]  benzonatate (TESSALON) 100 MG capsule Take 1 capsule (100 mg total) by mouth every 8 (eight) hours. Patient not taking: Reported on 05/02/2018 01/31/17   Garlon Hatchet, PA-C  methocarbamol (ROBAXIN) 750 MG tablet Take 1 tablet (750 mg total) by mouth 2 (two) times daily as needed for muscle spasms. Patient not taking: Reported on 05/02/2018 08/21/16   Tarry Kos, MD  morphine (MSIR) 15 MG tablet Take 1 tablet (15 mg total) by mouth every 4 (four) hours as needed for severe pain. Patient not taking: Reported on 08/27/2016 08/01/16   Melene Plan, DO  oxyCODONE (OXYCONTIN) 10 mg 12 hr tablet Take 1 tablet (10 mg total) by mouth every 12 (twelve) hours. Patient not taking: Reported on 08/27/2016 08/07/16   Tarry Kos, MD    Scheduled Meds: . Melene Muller ON 05/06/2018] pantoprazole  40 mg Intravenous Q12H   Continuous Infusions: . pantoprozole (PROTONIX) infusion 8 mg/hr (05/03/18 0311)  . piperacillin-tazobactam (ZOSYN)  IV Stopped (05/03/18 0237)   PRN Meds:.albuterol, budesonide, fluticasone, LORazepam, morphine injection, ondansetron (ZOFRAN) IV  Allergies as of 05/02/2018 - Review Complete 05/02/2018  Allergen Reaction Noted  . Ibuprofen Hives 08/12/2008  . Ketorolac tromethamine Hives 08/12/2008  . Tape Rash 04/10/2018    No family history on file.  Social History   Socioeconomic History  . Marital status: Single    Spouse name: Not on file  . Number of children: Not on file  . Years of education: Not on file  . Highest education level: Not on file  Occupational History  . Not on file  Social Needs  . Financial  resource strain: Not on file  . Food insecurity:    Worry: Not on file    Inability: Not on file  . Transportation needs:    Medical: Not on file    Non-medical: Not on file  Tobacco Use  . Smoking status: Current Every Day Smoker    Types: Cigarettes  . Smokeless tobacco: Never Used  . Tobacco comment: .75 of a can in a day  Substance and Sexual Activity  . Alcohol use: Yes    Comment: rarely  . Drug use: Yes    Types: Cocaine    Comment: heroin  . Sexual activity: Not on file  Lifestyle  . Physical activity:    Days per week: Not on file    Minutes per session: Not on file  . Stress: Not on file  Relationships  . Social connections:    Talks on phone: Not on file    Gets  together: Not on file    Attends religious service: Not on file    Active member of club or organization: Not on file    Attends meetings of clubs or organizations: Not on file    Relationship status: Not on file  . Intimate partner violence:    Fear of current or ex partner: Not on file    Emotionally abused: Not on file    Physically abused: Not on file    Forced sexual activity: Not on file  Other Topics Concern  . Not on file  Social History Narrative   ** Merged History Encounter **        Review of Systems: Review of Systems  Constitutional: Positive for malaise/fatigue. Negative for chills and fever.  HENT: Negative for hearing loss and tinnitus.   Eyes: Negative for blurred vision and double vision.  Respiratory: Positive for cough and sputum production. Negative for hemoptysis.   Cardiovascular: Negative for chest pain and palpitations.  Gastrointestinal: Positive for abdominal pain, blood in stool, diarrhea, heartburn, melena, nausea and vomiting.  Genitourinary: Negative for dysuria and urgency.  Musculoskeletal: Positive for back pain and myalgias.  Skin: Negative for itching and rash.  Neurological: Negative for seizures and loss of consciousness.  Endo/Heme/Allergies: Does not  bruise/bleed easily.  Psychiatric/Behavioral: Positive for substance abuse. Negative for suicidal ideas.    Physical Exam: Vital signs: Vitals:   05/03/18 0430 05/03/18 0517  BP: (!) 135/96 (!) 151/92  Pulse:  (!) 111  Resp: (!) 21 16  Temp:  99.3 F (37.4 C)  SpO2:  99%   Last BM Date: 05/02/18 Physical Exam  Constitutional: He is oriented to person, place, and time. He appears well-developed and well-nourished. No distress.  HENT:  Head: Normocephalic and atraumatic.  Mouth/Throat: Oropharynx is clear and moist. No oropharyngeal exudate.  Poor dentition  Eyes: EOM are normal. No scleral icterus.  Neck: Normal range of motion. Neck supple.  Cardiovascular: Normal rate, regular rhythm and normal heart sounds.  Pulmonary/Chest: Effort normal. No respiratory distress.  Bilateral rhonchi  Abdominal: Soft. Bowel sounds are normal. He exhibits no distension. There is tenderness. There is no rebound and no guarding.  Epigastric tenderness to palpation  Musculoskeletal: Normal range of motion. He exhibits no edema.  Neurological: He is alert and oriented to person, place, and time.  Skin: Skin is warm. No erythema.  Psychiatric: He has a normal mood and affect. Judgment normal.  Vitals reviewed.   GI:  Lab Results: Recent Labs    05/02/18 1600 05/03/18 0030 05/03/18 0351  WBC 20.7* 21.3* 19.4*  HGB 8.8* 9.3* 9.0*  HCT 29.3* 30.4* 30.1*  PLT 530* 532* 550*   BMET Recent Labs    05/02/18 1600 05/03/18 0351  NA 138 141  K 4.6 4.3  CL 106 107  CO2 26 26  GLUCOSE 93 92  BUN 15 7  CREATININE 0.80 0.91  CALCIUM 8.7* 8.7*   LFT Recent Labs    05/03/18 0351  PROT 6.7  ALBUMIN 2.9*  AST 19  ALT 25  ALKPHOS 45  BILITOT 0.5   PT/INR No results for input(s): LABPROT, INR in the last 72 hours.   Studies/Results: Ct Abdomen Pelvis W Contrast  Result Date: 05/02/2018 CLINICAL DATA:  Upper abdominal pain.  Rectal bleeding. EXAM: CT ABDOMEN AND PELVIS WITH  CONTRAST TECHNIQUE: Multidetector CT imaging of the abdomen and pelvis was performed using the standard protocol following bolus administration of intravenous contrast. CONTRAST:  OMNIPAQUE IOHEXOL  300 MG/ML  SOLN COMPARISON:  None. FINDINGS: Lower chest: Lung bases are clear. Hepatobiliary: No focal hepatic lesion. No biliary duct dilatation. Gallbladder is normal. Common bile duct is normal. Pancreas: Pancreas is normal. No ductal dilatation. No pancreatic inflammation. Spleen: Normal spleen Adrenals/urinary tract: Adrenal glands and kidneys are normal. The ureters and bladder normal. Stomach/Bowel: Stomach, small bowel, appendix, and cecum are normal. There is mild diffuse mucosal thickening in the ascending colon. No pericolonic inflammation. No diverticular disease this region. No discrete mass lesion. Finding best seen on sagittal image 21/7. The transverse and descending colon normal. Rectosigmoid colon appears normal. No pericolonic adenopathy Vascular/Lymphatic: Abdominal aorta is normal caliber. No periportal or retroperitoneal adenopathy. No pelvic adenopathy. Reproductive: Prostate normal Other: No free fluid. Musculoskeletal: No aggressive osseous lesion. IMPRESSION: 1. Mild mucosal thickening in the ascending colon is nonspecific. Finding could represent segmental colitis. 2. No additional abnormality of the bowel identified in otherwise normal scan. Electronically Signed   By: Genevive Bi M.D.   On: 05/02/2018 18:23    Impression/Plan: - coffee-ground emesis and melena. Patient with history of ulcerative esophagitis is done EGD done at  [facility] on 03/20/2018. - epigastric abdominal pain. Improving - Rectal bleeding. One episode of bright red blood per rectum. CT scan showing segmental wall thickening of ascending colon. - Substance abuse including cocaine abuse.  Recommendations ----------------------- - EGD tomorrow for further evaluation. Risk, benefits, alternatives  discussed with the patient. He verbalized understanding.continue PPI. - May need inpatient colonoscopy. - Okay to have full liquid diet today. Nothing by mouth past midnight. Monitor H&H. GI will follow   LOS: 1 day   Kathi Der  MD, FACP 05/03/2018, 8:50 AM  Contact #  [phone]

## 2018-05-03 NOTE — Progress Notes (Signed)
NOtified Dr. Barrie Folk about Hgb 7.6.

## 2018-05-03 NOTE — Progress Notes (Addendum)
PROGRESS NOTE  ELSIE SAKUMA WUJ:[id number] DOB: 1971-07-04 DOA: 05/02/2018 PCP: Patient, No Pcp Per  HPI/Recap of past 24 hours: #1 lower GI bleed: Secondary to colitis. Patient also has history of alcohol abuse and gastritis. This could be upper GI bleed from esophagitis. On IV Protonix.  His H&H has remained stable close to baseline from 3 weeks ago. Follow it serially every 6 hours. If low will transfuse.  Patient is on Zosyn per pharmacy. Patient to be counseled against taking nonsteroidals again.  GI seen and recommended  The following: - EGD tomorrow for further evaluation. Risk, benefits, alternatives discussed with the patient. He verbalized understanding.continue PPI. - May need inpatient colonoscopy. - Okay to have full liquid diet today. Nothing by mouth past midnight. Monitor H&H.   Patient is complaining of left shoulder pain and back pain he is already on morphine every 4 hours is asking for something in between.  I will start him on clonidine patch for both his shoulder and back pain   Assessment/Plan: Principal Problem:   GI bleed Active Problems:   GERD   Acute encephalopathy   Alcohol abuse   #1 lower GI bleed: Secondary to colitis. Patient also has history of alcohol abuse and gastritis. This could be upper GI bleed from esophagitis. We will initiate IV Protonix. Keep patient nothing by mouth. Consulted GI for  EGD in am and maybe colonoscopy too.   #2 segmental colitis: this may be responsible for patient's GI bleed. His H&H has remained stable close to baseline from 3 weeks ago. Follow it serially every 6 hours. If low will transfuse. Meanwhile initiate #1 lower GI bleed: Secondary to colitis. Patient also has history of alcohol abuse and gastritis. This could be upper GI bleed from esophagitis. We will initiate IV Protonix. Keep patient nothing by mouth. Consult GI for possible EGD and maybe colonoscopy. Treat colitis appropriately. Patient to be counseled against  taking nonsteroidals again.   #2 segmental colitis: this may be responsible for patient's GI bleed. His H&H has remained stable close to baseline from 3 weeks ago. Follow it serially every 6 hours. If low will transfuse. Meanwhile initiate IV Cipro and Flagyl  #3 acute encephalopathy: Combination of acute illness and medications. Patient is not awake   #4 GERD: Continue IV Protonix.  #5 history: Abuse: Patient has reported last alcohol intake 6 months ago. Monitor closely. If any sign of withdrawal we will initiate CIWA protocol.  #6 polysubstance abuse: Patient will need counseling. Consider urine drug screen. Is asking for more pain medicine   #3 acute encephalopathy: Combination of acute illness and medications. Patient is now awake ,AOX3 and asking for more pain medicine  #4 GERD: Continue IV Protonix.  #5 history: Abuse: Patient has reported last alcohol intake 6 months ago. Monitor closely. If any sign of withdrawal we will initiate CIWA protocol.  #6 polysubstance abuse: Patient will need counseling. Consider urine drug screen.  #7 left shoulder and back pain.  I will start  him on lidocaine patch    DVT prophylaxis: SCD  Code Status: Full  Family Communication:  Patient and significant other who was in the room her name is Gavin Pound Disposition Plan: Home  Consults called: GI Dr Lonny Prude by ER     Procedures:  none  Antimicrobials:  IV Cipro and Flagyl     Objective: Vitals:   05/03/18 0430 05/03/18 0517 05/03/18 0700 05/03/18 0906  BP: (!) 135/96 (!) 151/92  (!) 135/101  Pulse:  Marland Kitchen)  111  (!) 106  Resp: (!) 21 16  (!) 22  Temp:  99.3 F (37.4 C)  98.1 F (36.7 C)  TempSrc:  Oral  Oral  SpO2:  99%  99%  Height:    (1.676 m)     Intake/Output Summary (Last 24 hours) at 05/03/2018 0940 Last data filed at 05/03/2018 0600 Gross per 24 hour  Intake 1150 ml  Output 2400 ml  Net -1250 ml   There were no vitals filed for this  visit. Body mass index is 32.28 kg/m.  Exam:  General: Alert oriented x3 well-nourished Eyes: PERRL, lids and conjunctivae normal ENMT: Mucous membranes are moist. Posterior pharynx clear of any exudate or lesions.Normal dentition.  Neck: normal, supple, no masses, no thyromegaly Respiratory: clear to auscultation bilaterally, no wheezing, no crackles. Normal respiratory effort. No accessory muscle use.  Cardiovascular: Regular rate and rhythm, no murmurs / rubs / gallops. No extremity edema. 2+ pedal pulses. No carotid bruits.  Abdomen: mildly distended, some tenderness, no masses palpated. No hepatosplenomegaly. Bowel sounds positive.  Musculoskeletal: no clubbing / cyanosis. No joint deformity upper and lower extremities. Good ROM, no contractures. Normal muscle tone.  Skin: no rashes, lesions, ulcers. No induration Neurologic: CN 2-12 grossly intact. Sensation intact, DTR normal. Strength 5/5 in all 4.  Psychiatric: Normal judgment and insight. Alert and oriented x 3. Normal mood.       Data Reviewed: CBC: Recent Labs  Lab 05/02/18 1600 05/03/18 0030 05/03/18 0351  WBC 20.7* 21.3* 19.4*  HGB 8.8* 9.3* 9.0*  HCT 29.3* 30.4* 30.1*  MCV 80.7 79.6 80.1  PLT 530* 532* 550*   Basic Metabolic Panel: Recent Labs  Lab 05/02/18 1600 05/03/18 0351  NA 138 141  K 4.6 4.3  CL 106 107  CO2 26 26  GLUCOSE 93 92  BUN 15 7  CREATININE 0.80 0.91  CALCIUM 8.7* 8.7*   GFR: CrCl cannot be calculated (Unknown ideal weight.). Liver Function Tests: Recent Labs  Lab 05/02/18 1600 05/03/18 0351  AST 28 19  ALT 29 25  ALKPHOS 41 45  BILITOT 0.4 0.5  PROT 6.4* 6.7  ALBUMIN 3.1* 2.9*   Recent Labs  Lab 05/02/18 1600  LIPASE 33   No results for input(s): AMMONIA in the last 168 hours. Coagulation Profile: No results for input(s): INR, PROTIME in the last 168 hours. Cardiac Enzymes: No results for input(s): CKTOTAL, CKMB, CKMBINDEX, TROPONINI in the last 168 hours. BNP  (last 3 results) No results for input(s): PROBNP in the last 8760 hours. HbA1C: No results for input(s): HGBA1C in the last 72 hours. CBG: No results for input(s): GLUCAP in the last 168 hours. Lipid Profile: No results for input(s): CHOL, HDL, LDLCALC, TRIG, CHOLHDL, LDLDIRECT in the last 72 hours. Thyroid Function Tests: No results for input(s): TSH, T4TOTAL, FREET4, T3FREE, THYROIDAB in the last 72 hours. Anemia Panel: No results for input(s): VITAMINB12, FOLATE, FERRITIN, TIBC, IRON, RETICCTPCT in the last 72 hours. Urine analysis:    Component Value Date/Time   COLORURINE YELLOW 05/02/2018 1620   APPEARANCEUR CLEAR 05/02/2018 1620   LABSPEC 1.020 05/02/2018 1620   PHURINE 6.0 05/02/2018 1620   GLUCOSEU NEGATIVE 05/02/2018 1620   HGBUR NEGATIVE 05/02/2018 1620   BILIRUBINUR NEGATIVE 05/02/2018 1620   KETONESUR NEGATIVE 05/02/2018 1620   PROTEINUR NEGATIVE 05/02/2018 1620   UROBILINOGEN 1.0 07/21/2015 0637   NITRITE NEGATIVE 05/02/2018 1620   LEUKOCYTESUR NEGATIVE 05/02/2018 1620   Sepsis Labs: (procalcitonin:4,lacticidven:4)  )No results found for  this or any previous visit (from the past 240 hour(s)).    Studies: Ct Abdomen Pelvis W Contrast  Result Date: 05/02/2018 CLINICAL DATA:  Upper abdominal pain.  Rectal bleeding. EXAM: CT ABDOMEN AND PELVIS WITH CONTRAST TECHNIQUE: Multidetector CT imaging of the abdomen and pelvis was performed using the standard protocol following bolus administration of intravenous contrast. CONTRAST:  OMNIPAQUE IOHEXOL 300 MG/ML  SOLN COMPARISON:  None. FINDINGS: Lower chest: Lung bases are clear. Hepatobiliary: No focal hepatic lesion. No biliary duct dilatation. Gallbladder is normal. Common bile duct is normal. Pancreas: Pancreas is normal. No ductal dilatation. No pancreatic inflammation. Spleen: Normal spleen Adrenals/urinary tract: Adrenal glands and kidneys are normal. The ureters and bladder normal. Stomach/Bowel: Stomach,  small bowel, appendix, and cecum are normal. There is mild diffuse mucosal thickening in the ascending colon. No pericolonic inflammation. No diverticular disease this region. No discrete mass lesion. Finding best seen on sagittal image 21/7. The transverse and descending colon normal. Rectosigmoid colon appears normal. No pericolonic adenopathy Vascular/Lymphatic: Abdominal aorta is normal caliber. No periportal or retroperitoneal adenopathy. No pelvic adenopathy. Reproductive: Prostate normal Other: No free fluid. Musculoskeletal: No aggressive osseous lesion. IMPRESSION: 1. Mild mucosal thickening in the ascending colon is nonspecific. Finding could represent segmental colitis. 2. No additional abnormality of the bowel identified in otherwise normal scan. Electronically Signed   By: Genevive Bi M.D.   On: 05/02/2018 18:23    Scheduled Meds: . [START ON 05/06/2018] pantoprazole  40 mg Intravenous Q12H    Continuous Infusions: . pantoprozole (PROTONIX) infusion 8 mg/hr (05/03/18 0311)  . piperacillin-tazobactam (ZOSYN)  IV Stopped (05/03/18 0237)     LOS: 1 day     Myrtie Neither, MD [facility] pager [phone] To reach me or the doctor on call, go to: [email or web address] Password [email or web address]  05/03/2018, 9:40 AM

## 2018-05-03 NOTE — Progress Notes (Signed)
Called pharmacy regarding IV compatibility with banana bag, protonix, and zosyn. Told to give banana bag now and will have to adjust times for Zosyn. Protonix is already continuous.   Larey Days, RN

## 2018-05-04 ENCOUNTER — Inpatient Hospital Stay (HOSPITAL_COMMUNITY): Payer: Medicaid Other | Admitting: Certified Registered Nurse Anesthetist

## 2018-05-04 ENCOUNTER — Encounter (HOSPITAL_COMMUNITY)
Admission: EM | Disposition: A | Discharge status: Home / Self Care | Payer: Self-pay | Source: Home / Self Care | Attending: Internal Medicine

## 2018-05-04 ENCOUNTER — Encounter (HOSPITAL_COMMUNITY): Payer: Self-pay | Admitting: Certified Registered Nurse Anesthetist

## 2018-05-04 HISTORY — PX: ESOPHAGOGASTRODUODENOSCOPY (EGD) WITH PROPOFOL: SHX5813

## 2018-05-04 LAB — BASIC METABOLIC PANEL
ANION GAP: 9 (ref 5–15)
BUN: 5 mg/dL — AB (ref 6–20)
CO2: 27 mmol/L (ref 22–32)
Calcium: 9.1 mg/dL (ref 8.9–10.3)
Chloride: 101 mmol/L (ref 101–111)
Creatinine, Ser: 0.75 mg/dL (ref 0.61–1.24)
GFR calc Af Amer: >60 mL/min (ref >60)
Glucose, Bld: 85 mg/dL (ref 65–99)
POTASSIUM: 3.9 mmol/L (ref 3.5–5.1)
Sodium: 137 mmol/L (ref 135–145)

## 2018-05-04 LAB — CBC WITH DIFFERENTIAL/PLATELET
BASOS PCT: 0 %
Basophils Absolute: 0 10*3/uL (ref 0.0–0.1)
EOS PCT: 2 %
Eosinophils Absolute: 0.2 10*3/uL (ref 0.0–0.7)
HEMATOCRIT: 28.5 % — AB (ref 39.0–52.0)
Hemoglobin: 8.5 g/dL — ABNORMAL LOW (ref 13.0–17.0)
LYMPHS ABS: 2.8 10*3/uL (ref 0.7–4.0)
Lymphocytes Relative: 24 %
MCH: 23.5 pg — AB (ref 26.0–34.0)
MCHC: 29.8 g/dL — ABNORMAL LOW (ref 30.0–36.0)
MCV: 78.9 fL (ref 78.0–100.0)
MONOS PCT: 10 %
Monocytes Absolute: 1.2 10*3/uL — ABNORMAL HIGH (ref 0.1–1.0)
Neutro Abs: 7.4 10*3/uL (ref 1.7–7.7)
Neutrophils Relative %: 64 %
Platelets: 472 10*3/uL — ABNORMAL HIGH (ref 150–400)
RBC: 3.61 MIL/uL — AB (ref 4.22–5.81)
RDW: 15.6 % — ABNORMAL HIGH (ref 11.5–15.5)
WBC: 11.6 10*3/uL — AB (ref 4.0–10.5)

## 2018-05-04 LAB — CBC
HEMATOCRIT: 26.6 % — AB (ref 39.0–52.0)
Hemoglobin: 8 g/dL — ABNORMAL LOW (ref 13.0–17.0)
MCH: 23.7 pg — AB (ref 26.0–34.0)
MCHC: 30.1 g/dL (ref 30.0–36.0)
MCV: 78.9 fL (ref 78.0–100.0)
PLATELETS: 433 10*3/uL — AB (ref 150–400)
RBC: 3.37 MIL/uL — ABNORMAL LOW (ref 4.22–5.81)
RDW: 15.4 % (ref 11.5–15.5)
WBC: 9.8 10*3/uL (ref 4.0–10.5)

## 2018-05-04 SURGERY — ESOPHAGOGASTRODUODENOSCOPY (EGD) WITH PROPOFOL
Anesthesia: Monitor Anesthesia Care

## 2018-05-04 MED ORDER — ONDANSETRON HCL 4 MG/2ML IJ SOLN
INTRAMUSCULAR | Status: DC | PRN
  Administered 2018-05-04: 4 mg via INTRAVENOUS

## 2018-05-04 MED ORDER — HYDROCODONE-ACETAMINOPHEN 5-325 MG PO TABS
1.0000 | ORAL_TABLET | Freq: Four times a day (QID) | ORAL | Status: DC | PRN
  Administered 2018-05-04 – 2018-05-05 (×3): 1 via ORAL
  Filled 2018-05-04 (×3): qty 1

## 2018-05-04 MED ORDER — PANTOPRAZOLE SODIUM 40 MG PO TBEC
40.0000 mg | DELAYED_RELEASE_TABLET | Freq: Two times a day (BID) | ORAL | Status: DC
  Administered 2018-05-04 – 2018-05-05 (×3): 40 mg via ORAL
  Filled 2018-05-04 (×3): qty 1

## 2018-05-04 MED ORDER — LIDOCAINE 2% (20 MG/ML) 5 ML SYRINGE
INTRAMUSCULAR | Status: DC | PRN
  Administered 2018-05-04: 60 mg via INTRAVENOUS

## 2018-05-04 MED ORDER — PEG 3350-KCL-NA BICARB-NACL 420 G PO SOLR
4000.0000 mL | Freq: Once | ORAL | Status: AC
  Administered 2018-05-04: 4000 mL via ORAL
  Filled 2018-05-04: qty 4000

## 2018-05-04 MED ORDER — PROPOFOL 500 MG/50ML IV EMUL
INTRAVENOUS | Status: DC | PRN
  Administered 2018-05-04: 100 ug/kg/min via INTRAVENOUS

## 2018-05-04 MED ORDER — SODIUM CHLORIDE 0.9 % IV SOLN
INTRAVENOUS | Status: DC
  Administered 2018-05-04: 16:00:00 via INTRAVENOUS

## 2018-05-04 MED ORDER — LACTATED RINGERS IV SOLN
INTRAVENOUS | Status: DC
  Administered 2018-05-04: 09:00:00 via INTRAVENOUS

## 2018-05-04 MED ORDER — SODIUM CHLORIDE 0.9 % IV SOLN: INTRAVENOUS | Status: DC

## 2018-05-04 MED ORDER — PROPOFOL 10 MG/ML IV BOLUS
INTRAVENOUS | Status: DC | PRN
  Administered 2018-05-04 (×2): 20 mg via INTRAVENOUS

## 2018-05-04 SURGICAL SUPPLY — 15 items

## 2018-05-04 NOTE — Progress Notes (Signed)
PROGRESS NOTE  Samuel Barrett ZOX:[id number] DOB: 15-Jun-1971 DOA: 05/02/2018 PCP: Patient, No Pcp Per  Narrative: 47 year old male wory of polysubstance abuse, prior history of alcohol and cocaine abuserecently discharged from hospital 4/16 after treatment for drug overdose/aspiration pneumonia admitted with nausea vomiting abdominal pain and melena, one episode of coffee-ground emesis. He was hospitalized at [facility] in March, had EGD 3/22 which showed severe distal esophagitis and ulcer at GE junction. Clinically stable now, started PPI, reports ongoing melena, plan for endoscopy today  Assessment/Plan:  1. Melena/coffee-ground emesis -Primarily suspect upper GI bleed, prior history of ulcerative esophagitis and gastric ulcer on endoscopy 3/22 at outside hospital -Continue IV PPI -gastroenterology consulting -Plan for EGD today, he also had one episode of rectal bleed bleeding/BRBPR, CT on admission showed segmental wall thickening of the ascending colon, clinically doubt active colitis, remains on IV Zosyn, low threshold to stop -may need colonoscopy if endoscopy is unrevealing  2. Acute blood loss anemia -Hemoglobin is 8.5 today from baseline around 9-9.5  monitors-Monitor CBC Every 12 -No indication for transfusion at  3. GERD/gastric ulcer Continue Protonix  4. Polysubstance abuse -prior history of EtOH abuse as well, last drink reportedly 6 months ago -No evidence of withdrawal -Continue thiamine  DVT prophylaxis: SCD  Code Status: Full  Family Communication:  home in 1-2 days pending above GI workup Disposition Plan: Home  Consults called: GI Dr Levora Angel     Procedures:  none  Antimicrobials:  IV Cipro and Flagyl   Subjective: some nausea, + melena last pm1  Objective: Vitals:   05/04/18 1011 05/04/18 1012 05/04/18 1020 05/04/18 1051  BP: (!) 88/58 (!) 100/41  (!) 127/96  Pulse: 81 81 98 96  Resp: 12 12 (!) 24 20  Temp: 98.5 F (36.9 C)       TempSrc: Oral     SpO2: 98% 100% 100% 95%  Height:        Intake/Output Summary (Last 24 hours) at 05/04/2018 1114 Last data filed at 05/04/2018 1059 Gross per 24 hour  Intake 1826.25 ml  Output 4520 ml  Net -2693.75 ml   There were no vitals filed for this visit. Body mass index is 32.28 kg/m.  Exam:  Gen: Awake, Alert, Oriented X 3,  HEENT: PERRLA, Neck supple, no JVD Lungs: Good air movement bilaterally, CTAB CVS: RRR,No Gallops,Rubs or new Murmurs Abd: soft, Non tender, distended, BS present Extremities: No Cyanosis, Clubbing or edema Skin: no new rashes Psychiatric: Normal judgment and insight. Alert and oriented x 3. Normal mood.       Data Reviewed: CBC: Recent Labs  Lab 05/03/18 0030 05/03/18 0351 05/03/18 1125 05/03/18 1707 05/04/18 0504  WBC 21.3* 19.4* 16.8* 12.9* 11.6*  NEUTROABS  --   --   --   --  7.4  HGB 9.3* 9.0* 8.5* 7.6* 8.5*  HCT 30.4* 30.1* 27.9* 25.4* 28.5*  MCV 79.6 80.1 79.7 79.1 78.9  PLT 532* 550* 442* 443* 472*   Basic Metabolic Panel: Recent Labs  Lab 05/02/18 1600 05/03/18 0351 05/04/18 0504  NA 138 141 137  K 4.6 4.3 3.9  CL 106 107 101  CO2 GLUCOSE 93 92 85  BUN 15 7 5*  CREATININE 0.80 0.91 0.75  CALCIUM 8.7* 8.7* 9.1   GFR: CrCl cannot be calculated (Unknown ideal weight.). Liver Function Tests: Recent Labs  Lab 05/02/18 1600 05/03/18 0351  AST 28 19  ALT 29 25  ALKPHOS 41 45  BILITOT 0.4  0.5  PROT 6.4* 6.7  ALBUMIN 3.1* 2.9*   Recent Labs  Lab 05/02/18 1600  LIPASE 33   No results for input(s): AMMONIA in the last 168 hours. Coagulation Profile: No results for input(s): INR, PROTIME in the last 168 hours. Cardiac Enzymes: No results for input(s): CKTOTAL, CKMB, CKMBINDEX, TROPONINI in the last 168 hours. BNP (last 3 results) No results for input(s): PROBNP in the last 8760 hours. HbA1C: No results for input(s): HGBA1C in the last 72 hours. CBG: No results for input(s): GLUCAP in the  last 168 hours. Lipid Profile: No results for input(s): CHOL, HDL, LDLCALC, TRIG, CHOLHDL, LDLDIRECT in the last 72 hours. Thyroid Function Tests: No results for input(s): TSH, T4TOTAL, FREET4, T3FREE, THYROIDAB in the last 72 hours. Anemia Panel: No results for input(s): VITAMINB12, FOLATE, FERRITIN, TIBC, IRON, RETICCTPCT in the last 72 hours. Urine analysis:    Component Value Date/Time   COLORURINE YELLOW 05/02/2018 1620   APPEARANCEUR CLEAR 05/02/2018 1620   LABSPEC 1.020 05/02/2018 1620   PHURINE 6.0 05/02/2018 1620   GLUCOSEU NEGATIVE 05/02/2018 1620   HGBUR NEGATIVE 05/02/2018 1620   BILIRUBINUR NEGATIVE 05/02/2018 1620   KETONESUR NEGATIVE 05/02/2018 1620   PROTEINUR NEGATIVE 05/02/2018 1620   UROBILINOGEN 1.0 07/21/2015 0637   NITRITE NEGATIVE 05/02/2018 1620   LEUKOCYTESUR NEGATIVE 05/02/2018 1620   Sepsis Labs: (procalcitonin:4,lacticidven:4)  )No results found for this or any previous visit (from the past 240 hour(s)).    Studies: No results found.  Scheduled Meds: . lidocaine  2 patch Transdermal q morning - 10a  . pantoprazole  40 mg Oral BID  . polyethylene glycol-electrolytes  4,000 mL Oral Once    Continuous Infusions: . piperacillin-tazobactam (ZOSYN)  IV Stopped (05/04/18 1610)     LOS: 2 days     Zannie Cove, MD [facility] Page via [email or web address] Password [email or web address]  05/04/2018, 11:14 AM

## 2018-05-04 NOTE — Anesthesia Procedure Notes (Signed)
Procedure Name: MAC Date/Time: 05/04/2018 9:55 AM Performed by: Candis Shine, CRNA Pre-anesthesia Checklist: Patient identified, Emergency Drugs available, Suction available, Patient being monitored and Timeout performed Patient Re-evaluated:Patient Re-evaluated prior to induction Oxygen Delivery Method: Nasal cannula Dental Injury: Teeth and Oropharynx as per pre-operative assessment

## 2018-05-04 NOTE — Care Management Note (Addendum)
Case Management Note  Patient Details  Name: TIM CORRIHER MRN: [id number] Date of Birth: 13-Sep-1971  Subjective/Objective:  History of polysubstance use,cocaine abuse, history of esophagitis; Admitted for GI Bleed           Action/Plan: GI consulted-EGD showed e LA grade D erosive esophagitis.no active bleeding. Colonoscopy tomorrow 05/05/2018 for evaluation of rectal bleeding and abnormal CT scan.  No PCP noted.  NCM provided (2) provider options for uninsured patients.  NCM will continue to follow for discharge transition needs.  Expected Discharge Date:    05/05/2018              Expected Discharge Plan:  Home/Self Care  In-House Referral:   N/A  Discharge planning Services  CM Consult, [facility]  Status of Service:  In process, will continue to follow  Yancey Flemings, RN 05/04/2018, 11:19 AM

## 2018-05-04 NOTE — Brief Op Note (Signed)
05/02/2018 - 05/04/2018  10:13 AM  PATIENT:  Samuel Barrett  47 y.o. male  PRE-OPERATIVE DIAGNOSIS:  Coffee-ground emesis, melena  POST-OPERATIVE DIAGNOSIS:  Esophagitis   PROCEDURE:  Procedure(s): ESOPHAGOGASTRODUODENOSCOPY (EGD) WITH PROPOFOL (N/A)  SURGEON:  Surgeon(s) and Role:    * Cele Mote, MD - Primary   Findings ------------ - EGD showed e LA grade D erosive esophagitis.no active bleeding  Recommendations -------------------------- - clear liquid diet today. - Colonoscopy tomorrow for evaluation of rectal bleeding and abnormal CT scan. - Nothing by mouth past midnight - Change Protonix to by mouth twice a day. - Recommend repeat EGD in 8 weeks to document healing of esophagitis. - Avoid NSAIDs - GI will follow  Kathi Der MD, FACP 05/04/2018, 10:15 AM  Contact #  [phone]

## 2018-05-04 NOTE — Anesthesia Preprocedure Evaluation (Signed)
Anesthesia Evaluation  Patient identified by MRN, date of birth, ID band Patient awake    Reviewed: Allergy & Precautions, NPO status , Patient's Chart, lab work & pertinent test results  Airway Mallampati: II  TM Distance: >3 FB Neck ROM: Full    Dental no notable dental hx.    Pulmonary Current Smoker,    Pulmonary exam normal breath sounds clear to auscultation       Cardiovascular negative cardio ROS Normal cardiovascular exam Rhythm:Regular Rate:Normal     Neuro/Psych negative neurological ROS  negative psych ROS   GI/Hepatic GERD  ,(+)     substance abuse  alcohol use and cocaine use,   Endo/Other  negative endocrine ROS  Renal/GU negative Renal ROS  negative genitourinary   Musculoskeletal negative musculoskeletal ROS (+)   Abdominal   Peds negative pediatric ROS (+)  Hematology  (+) anemia ,   Anesthesia Other Findings   Reproductive/Obstetrics negative OB ROS                             Anesthesia Physical Anesthesia Plan  ASA: III  Anesthesia Plan: MAC   Post-op Pain Management:    Induction: Intravenous  PONV Risk Score and Plan: 0  Airway Management Planned: Simple Face Mask  Additional Equipment:   Intra-op Plan:   Post-operative Plan:   Informed Consent: I have reviewed the patients History and Physical, chart, labs and discussed the procedure including the risks, benefits and alternatives for the proposed anesthesia with the patient or authorized representative who has indicated his/her understanding and acceptance.   Dental advisory given  Plan Discussed with: CRNA and Surgeon  Anesthesia Plan Comments:         Anesthesia Quick Evaluation

## 2018-05-04 NOTE — Addendum Note (Signed)
Addendum  created 05/04/18 1028 by Jed Limerick, CRNA   Intraprocedure Flowsheets edited

## 2018-05-04 NOTE — Interval H&P Note (Signed)
History and Physical Interval Note:  05/04/2018 9:52 AM  Samuel Barrett  has presented today for surgery, with the diagnosis of Coffee-ground emesis, melena  The various methods of treatment have been discussed with the patient and family. After consideration of risks, benefits and other options for treatment, the patient has consented to  Procedure(s): ESOPHAGOGASTRODUODENOSCOPY (EGD) WITH PROPOFOL (N/A) as a surgical intervention .  The patient's history has been reviewed, patient examined, no change in status, stable for surgery.  I have reviewed the patient's chart and labs.  Questions were answered to the patient's satisfaction.    Risks (bleeding, infection, bowel perforation that could require surgery, sedation-related changes in cardiopulmonary systems), benefits (identification and possible treatment of source of symptoms, exclusion of certain causes of symptoms), and alternatives (watchful waiting, radiographic imaging studies, empiric medical treatment)  were explained to patient  in detail and patient wishes to proceed.  Samuel Barrett

## 2018-05-04 NOTE — Anesthesia Postprocedure Evaluation (Signed)
Anesthesia Post Note  Patient: Samuel Barrett  Procedure(s) Performed: ESOPHAGOGASTRODUODENOSCOPY (EGD) WITH PROPOFOL (N/A )     Patient location during evaluation: PACU Anesthesia Type: MAC Level of consciousness: awake and alert Pain management: pain level controlled Vital Signs Assessment: post-procedure vital signs reviewed and stable Respiratory status: spontaneous breathing, nonlabored ventilation, respiratory function stable and patient connected to nasal cannula oxygen Cardiovascular status: stable and blood pressure returned to baseline Postop Assessment: no apparent nausea or vomiting Anesthetic complications: no    Last Vitals:  Vitals:   05/04/18 1011 05/04/18 1012  BP: (!) 88/58 (!) 100/41  Pulse: 81 81  Resp: 12 12  Temp: 36.9 C   SpO2: 98% 100%    Last Pain:  Vitals:   05/04/18 1011  TempSrc: Oral  PainSc:                  Adrien Dietzman S

## 2018-05-04 NOTE — Op Note (Signed)
[facility] Patient Name: Samuel Barrett Procedure Date : 05/04/2018 MRN: [id number] Attending MD: Kathi Der , MD Date of Birth: September 28, 1971 CSN: [id number] Age: 47 Admit Type: Inpatient Procedure:                Upper GI endoscopy Indications:              Coffee-ground emesis, Melena Providers:                Kathi Der, MD, Jacquiline Doe, RN, Zoila Shutter, Technician, Arva Chafe, CRNA Referring MD:              Medicines:                Sedation Administered by an Anesthesia Professional Complications:            No immediate complications. Estimated Blood Loss:     Estimated blood loss: none. Procedure:                Pre-Anesthesia Assessment:                           - Prior to the procedure, a History and Physical                            was performed, and patient medications and                            allergies were reviewed. The patient's tolerance of                            previous anesthesia was also reviewed. The risks                            and benefits of the procedure and the sedation                            options and risks were discussed with the patient.                            All questions were answered, and informed consent                            was obtained. Prior Anticoagulants: The patient has                            taken no previous anticoagulant or antiplatelet                            agents. ASA Grade Assessment: III - A patient with                            severe systemic disease. After reviewing the risks  and benefits, the patient was deemed in                            satisfactory condition to undergo the procedure.                           After obtaining informed consent, the endoscope was                            passed under direct vision. Throughout the                            procedure, the patient's blood pressure, pulse, and                             oxygen saturations were monitored continuously. The                            [id number] ([id number]) scope was introduced through the                            mouth, and advanced to the second part of duodenum.                            The upper GI endoscopy was accomplished without                            difficulty. The patient tolerated the procedure                            well. Scope In: Scope Out: Findings:      LA Grade D (one or more mucosal breaks involving at least 75% of       esophageal circumference) esophagitis was found in the distal esophagus.      A small hiatal hernia was present.      Normal mucosa was found in the entire examined stomach.      The cardia and gastric fundus were normal on retroflexion.      The duodenal bulb, first portion of the duodenum and second portion of       the duodenum were normal. Impression:               - LA Grade D erosive esophagitis.                           - Small hiatal hernia.                           - Normal mucosa was found in the entire stomach.                           - Normal duodenal bulb, first portion of the                            duodenum and second portion of the duodenum.                           -  No specimens collected. Recommendation:           - Return patient to hospital ward for ongoing care.                           - Clear liquid diet.                           - Continue present medications.                           - Await pathology results.                           - Repeat upper endoscopy in 2 months to check                            healing.                           - Perform a colonoscopy tomorrow. Procedure Code(s):        --- Professional ---                           [phone], Esophagogastroduodenoscopy, flexible,                            transoral; diagnostic, including collection of                            specimen(s) by brushing or washing, when  performed                            (separate procedure) Diagnosis Code(s):        --- Professional ---                           K20.8, Other esophagitis                           K92.0, Hematemesis                           K92.1, Melena (includes Hematochezia) CPT copyright 2017 American Medical Association. All rights reserved. The codes documented in this report are preliminary and upon coder review may  be revised to meet current compliance requirements. Kathi Der, MD Kathi Der, MD 05/04/2018 10:10:54 AM Number of Addenda: 0

## 2018-05-04 NOTE — Progress Notes (Signed)
RN found pt this am with a Coca-Cola bottle in his hand and half of the drink gone. Pt is supposed to be NPO, as of midnight last night, for an EGD this am. Pt stated he was not drinking it, but using it for a spit bottle. RN called and spoke with Dr. Levora Angel, with [facility], to make aware. MD stated that since procedure is scheduled for a couple hours out that patient should be fine.   Larey Days, RN

## 2018-05-04 NOTE — Transfer of Care (Signed)
Immediate Anesthesia Transfer of Care Note  Patient: Samuel Barrett  Procedure(s) Performed: ESOPHAGOGASTRODUODENOSCOPY (EGD) WITH PROPOFOL (N/A )  Patient Location: Endoscopy Unit  Anesthesia Type:MAC  Level of Consciousness: drowsy  Airway & Oxygen Therapy: Patient Spontanous Breathing and Patient connected to nasal cannula oxygen  Post-op Assessment: Report given to RN and Post -op Vital signs reviewed and stable  Post vital signs: Reviewed and stable  Last Vitals:  Vitals Value Taken Time  BP 88/58 05/04/2018 10:11 AM  Temp 36.9 C 05/04/2018 10:11 AM  Pulse 82 05/04/2018 10:12 AM  Resp 12 05/04/2018 10:12 AM  SpO2 99 % 05/04/2018 10:12 AM  Vitals shown include unvalidated device data.  Last Pain:  Vitals:   05/04/18 1011  TempSrc: Oral  PainSc:       Patients Stated Pain Goal: 0 (33/35/45 6256)  Complications: No apparent anesthesia complications

## 2018-05-05 ENCOUNTER — Inpatient Hospital Stay (HOSPITAL_COMMUNITY): Payer: Medicaid Other | Admitting: Certified Registered Nurse Anesthetist

## 2018-05-05 ENCOUNTER — Encounter (HOSPITAL_COMMUNITY)
Admission: EM | Disposition: A | Discharge status: Home / Self Care | Payer: Self-pay | Source: Home / Self Care | Attending: Internal Medicine

## 2018-05-05 ENCOUNTER — Encounter (HOSPITAL_COMMUNITY): Payer: Self-pay

## 2018-05-05 DIAGNOSIS — K2901 Acute gastritis with bleeding: Secondary | ICD-10-CM

## 2018-05-05 DIAGNOSIS — G9340 Encephalopathy, unspecified: Secondary | ICD-10-CM

## 2018-05-05 DIAGNOSIS — F1010 Alcohol abuse, uncomplicated: Secondary | ICD-10-CM

## 2018-05-05 HISTORY — PX: COLONOSCOPY WITH PROPOFOL: SHX5780

## 2018-05-05 LAB — CBC
HCT: 27.2 % — ABNORMAL LOW (ref 39.0–52.0)
HEMOGLOBIN: 8.2 g/dL — AB (ref 13.0–17.0)
MCH: 23.9 pg — ABNORMAL LOW (ref 26.0–34.0)
MCHC: 30.1 g/dL (ref 30.0–36.0)
MCV: 79.3 fL (ref 78.0–100.0)
Platelets: 422 10*3/uL — ABNORMAL HIGH (ref 150–400)
RBC: 3.43 MIL/uL — ABNORMAL LOW (ref 4.22–5.81)
RDW: 15.5 % (ref 11.5–15.5)
WBC: 8.1 10*3/uL (ref 4.0–10.5)

## 2018-05-05 LAB — BASIC METABOLIC PANEL
ANION GAP: 8 (ref 5–15)
BUN: <5 mg/dL — ABNORMAL LOW (ref 6–20)
CALCIUM: 9 mg/dL (ref 8.9–10.3)
CHLORIDE: 100 mmol/L — AB (ref 101–111)
CO2: 28 mmol/L (ref 22–32)
CREATININE: 0.79 mg/dL (ref 0.61–1.24)
GFR calc non Af Amer: >60 mL/min (ref >60)
Glucose, Bld: 87 mg/dL (ref 65–99)
Potassium: 3.8 mmol/L (ref 3.5–5.1)
Sodium: 136 mmol/L (ref 135–145)

## 2018-05-05 SURGERY — COLONOSCOPY WITH PROPOFOL
Anesthesia: Monitor Anesthesia Care

## 2018-05-05 MED ORDER — HYDROMORPHONE HCL 1 MG/ML IJ SOLN: 0.2500 mg | INTRAMUSCULAR | Status: DC | PRN

## 2018-05-05 MED ORDER — PROPOFOL 500 MG/50ML IV EMUL
INTRAVENOUS | Status: DC | PRN
  Administered 2018-05-05: 100 ug/kg/min via INTRAVENOUS

## 2018-05-05 MED ORDER — MEPERIDINE HCL 25 MG/ML IJ SOLN: 6.2500 mg | INTRAMUSCULAR | Status: DC | PRN

## 2018-05-05 MED ORDER — OMEPRAZOLE 20 MG PO CPDR: 40.0000 mg | DELAYED_RELEASE_CAPSULE | Freq: Two times a day (BID) | ORAL | 0 refills | Status: AC

## 2018-05-05 MED ORDER — PROPOFOL 10 MG/ML IV BOLUS
INTRAVENOUS | Status: DC | PRN
  Administered 2018-05-05: 40 mg via INTRAVENOUS
  Administered 2018-05-05: 20 mg via INTRAVENOUS
  Administered 2018-05-05: 10 mg via INTRAVENOUS
  Administered 2018-05-05: 40 mg via INTRAVENOUS
  Administered 2018-05-05: 10 mg via INTRAVENOUS

## 2018-05-05 MED ORDER — LACTATED RINGERS IV SOLN
INTRAVENOUS | Status: DC
  Administered 2018-05-05: 1000 mL via INTRAVENOUS

## 2018-05-05 MED ORDER — LIDOCAINE 2% (20 MG/ML) 5 ML SYRINGE
INTRAMUSCULAR | Status: DC | PRN
  Administered 2018-05-05: 100 mg via INTRAVENOUS

## 2018-05-05 MED ORDER — ONDANSETRON HCL 4 MG/2ML IJ SOLN: 4.0000 mg | Freq: Once | INTRAMUSCULAR | Status: DC | PRN

## 2018-05-05 SURGICAL SUPPLY — 22 items

## 2018-05-05 NOTE — Progress Notes (Signed)
Paged Dr. Arvilla Meres.  Pt is scoring 6 on CIWA protocal.  Pt to be discharged.

## 2018-05-05 NOTE — Transfer of Care (Signed)
Immediate Anesthesia Transfer of Care Note  Patient: Samuel Barrett  Procedure(s) Performed: COLONOSCOPY WITH PROPOFOL (N/A )  Patient Location: Endoscopy Unit  Anesthesia Type:MAC  Level of Consciousness: awake  Airway & Oxygen Therapy: Patient Spontanous Breathing and Patient connected to nasal cannula oxygen  Post-op Assessment: Report given to RN and Post -op Vital signs reviewed and stable  Post vital signs: Reviewed and stable  Last Vitals:  Vitals Value Taken Time  BP    Temp    Pulse 80 05/05/2018  8:58 AM  Resp 18 05/05/2018  8:58 AM  SpO2 100 % 05/05/2018  8:58 AM  Vitals shown include unvalidated device data.  Last Pain:  Vitals:   05/05/18 0752  TempSrc: Oral  PainSc: 7       Patients Stated Pain Goal: 0 (26/71/24 5809)  Complications: No apparent anesthesia complications

## 2018-05-05 NOTE — Progress Notes (Signed)
Dr. Jomarie Longs T/O Ok to discharge.  Pt discharge instructions given and prescription.  Pt verbalized understanding.  VSS. Pt will be leaving floor via wheelchair accompanied by staff and friend.

## 2018-05-05 NOTE — Op Note (Signed)
[facility] Patient Name: Samuel Barrett Procedure Date : 05/05/2018 MRN: [id number] Attending MD: Kathi Der , MD Date of Birth: February 07, 1971 CSN: [id number] Age: 47 Admit Type: Inpatient Procedure:                Colonoscopy Indications:              Rectal bleeding, Abnormal CT of the GI tract Providers:                Kathi Der, MD, Clearnce Sorrel, RN, Beryle Beams, Technician Referring MD:              Medicines:                Sedation Administered by an Anesthesia Professional Complications:            No immediate complications. Estimated Blood Loss:     Estimated blood loss was minimal. Procedure:                Pre-Anesthesia Assessment:                           - Prior to the procedure, a History and Physical                            was performed, and patient medications and                            allergies were reviewed. The patient's tolerance of                            previous anesthesia was also reviewed. The risks                            and benefits of the procedure and the sedation                            options and risks were discussed with the patient.                            All questions were answered, and informed consent                            was obtained. Prior Anticoagulants: The patient has                            taken no previous anticoagulant or antiplatelet                            agents. ASA Grade Assessment: III - A patient with                            severe systemic disease. After reviewing the risks  and benefits, the patient was deemed in                            satisfactory condition to undergo the procedure.                           After obtaining informed consent, the colonoscope                            was passed under direct vision. Throughout the                            procedure, the patient's blood pressure, pulse, and                           oxygen saturations were monitored continuously. The                            [id number] ([id number]) scope was introduced through                            the anus and advanced to the the terminal ileum,                            with identification of the appendiceal orifice and                            IC valve. The colonoscopy was performed without                            difficulty. The patient tolerated the procedure                            well. The quality of the bowel preparation was                            adequate. Scope In: 8:37:01 AM Scope Out: 8:56:55 AM Scope Withdrawal Time: 0 hours 15 minutes 16 seconds  Total Procedure Duration: 0 hours 19 minutes 54 seconds  Findings:      The perianal and digital rectal examinations were normal.      The terminal ileum appeared normal.      Multiple scattered ulcers (most likely NSAIDs indiced. )were found in       the ascending colon. No bleeding was present. Biopsies were taken with a       cold forceps for histology.      Anal papilla(e) were hypertrophied.      No additional abnormalities were found on retroflexion. Impression:               - The examined portion of the ileum was normal.                           - Multiple ulcers in the ascending colon. Biopsied.  most likely NSAIDs indiced.                           - Anal papilla(e) were hypertrophied. Recommendation:           - Return patient to hospital ward for ongoing care.                           - Resume previous diet.                           - Continue present medications.                           - Await pathology results.                           - Repeat colonoscopy date to be determined after                            pending pathology results are reviewed to check                            healing.                           - Return to my office in 2 months. Procedure Code(s):        ---  Professional ---                           [id number], Colonoscopy, flexible; with biopsy, single                            or multiple Diagnosis Code(s):        --- Professional ---                           K63.3, Ulcer of intestine                           K62.89, Other specified diseases of anus and rectum                           K62.5, Hemorrhage of anus and rectum                           R93.3, Abnormal findings on diagnostic imaging of                            other parts of digestive tract CPT copyright 2017 American Medical Association. All rights reserved. The codes documented in this report are preliminary and upon coder review may  be revised to meet current compliance requirements. Kathi Der, MD Kathi Der, MD 05/05/2018 9:01:02 AM Number of Addenda: 0

## 2018-05-05 NOTE — Discharge Summary (Signed)
Physician Discharge Summary  Samuel Barrett ZOX:[id number] DOB: [birth year] DOA: 05/02/2018  PCP: Patient, No Pcp Per  Admit date: 05/02/2018 Discharge date: 05/05/2018  Time spent: 35 minutes  Recommendations for Outpatient Follow-up:  1. PCP in 1 week, please check CBC at FU and start Iron 2. Gi Dr.Brahmbhatt in 6weeks   Discharge Diagnoses:  Principal Problem:   GI bleed   Esophagitis   Colon ulcers   GERD   Acute encephalopathy   Alcohol abuse   Discharge Condition: improved  Diet recommendation: regular  Filed Weights   05/04/18 2215 05/05/18 0752  Weight: 85.3 kg (188 lb) 85.3 kg (188 lb)    History of present illness:  47 year old male wory of polysubstance abuse, prior history of alcohol and cocaine abuserecently discharged from hospital 4/16 after treatment for drug overdose/aspiration pneumonia admitted with nausea vomiting abdominal pain and melena, one episode of coffee-ground emesis. He was hospitalized at [facility] in March, had EGD 3/22 which showed severe distal esophagitis and ulcer at GE junction.    Hospital Course:   Melena/coffee-ground emesis -due to grade D esophagitis, prior history of ulcerative esophagitis and gastric ulcer on endoscopy 3/22 at outside hospital -treated with IV PPI, gastroenterology consulted -EGD showed grade D esophagitis, also due to history of bright red blood per rectum, he underwent a colonoscopy this morning which showed ascending colon ulcers which were biopsied this was felt to be NSAID induced as well -No further active bleeding, hemoglobin relatively stable discharged home on PPI twice a day for 1 month, advised follow-up with PCP in one week to check CBC and start iron therapy then Also recommended follow-up with gastroenterology in 6 weeks to follow-up on biopsy results and also schedule repeat endoscopy  2. Acute blood loss anemia -Hemoglobin is 8 from baseline around 9-9.5 -check CBC at one week in follow-up and  start oral iron therapy than  3. GERD/gastric ulcer Continue Protonix  4. Polysubstance abuse -prior history of EtOH abuse as well, last drink reportedly 6 months ago -No evidence of withdrawal -Continue thiamine     Procedures:  EGD: Findings ------------ - EGD showed e LA grade D erosive esophagitis.no active bleeding    Colonoscopy: Impression:               - The examined portion of the ileum was normal.                           - Multiple ulcers in the ascending colon. Biopsied.                            most likely NSAIDs indiced.                           - Anal papilla(e) were hypertrophied.   Consultations:  Gi Dr.Brahmbhatt  Discharge Exam: Vitals:   05/05/18 0914 05/05/18 0939  BP: (!) 126/91 (!) 141/101  Pulse: 86 91  Resp: (!) 23 18  Temp:  98.3 F (36.8 C)  SpO2: 97% 100%    General: AAOx3 Cardiovascular: S1S2/RRR Respiratory: CTAB  Discharge Instructions   Discharge Instructions    Diet - low sodium heart healthy   Complete by:  As directed    Increase activity slowly   Complete by:  As directed      Allergies as of 05/05/2018      Reactions  Ibuprofen Hives   Ketorolac Tromethamine Hives   Tape Rash   Reaction to some types of tape      Medication List    STOP taking these medications   benzonatate 100 MG capsule Commonly known as:  TESSALON   gabapentin 300 MG capsule Commonly known as:  NEURONTIN   methocarbamol 750 MG tablet Commonly known as:  ROBAXIN   morphine 15 MG tablet Commonly known as:  MSIR   oxyCODONE 10 mg 12 hr tablet Commonly known as:  OXYCONTIN     TAKE these medications   acetaminophen 500 MG tablet Commonly known as:  TYLENOL Take 1 tablet (500 mg total) by mouth every 6 (six) hours as needed for headache (pain). What changed:  how much to take   albuterol 108 (90 Base) MCG/ACT inhaler Commonly known as:  PROVENTIL HFA;VENTOLIN HFA Inhale 1-2 puffs into the lungs every 6 (six) hours as  needed for wheezing.   amLODipine 5 MG tablet Commonly known as:  NORVASC Take 5 mg by mouth daily.   beclomethasone 40 MCG/ACT inhaler Commonly known as:  QVAR Inhale 2 puffs into the lungs 2 (two) times daily as needed (shortness of breath/ wheezing).   fluticasone 50 MCG/ACT nasal spray Commonly known as:  FLONASE Place 1 spray into both nostrils daily as needed (congestion).   ipratropium 17 MCG/ACT inhaler Commonly known as:  ATROVENT HFA Inhale 2 puffs into the lungs every 6 (six) hours as needed for wheezing.   mirtazapine 15 MG tablet Commonly known as:  REMERON Take 45 mg by mouth at bedtime.   omeprazole 20 MG capsule Commonly known as:  PRILOSEC Take 2 capsules (40 mg total) by mouth 2 (two) times daily before a meal.   tiZANidine 4 MG tablet Commonly known as:  ZANAFLEX Take 4-8 mg by mouth every 6 (six) hours as needed for muscle spasms.      Allergies  Allergen Reactions  . Ibuprofen Hives  . Ketorolac Tromethamine Hives  . Tape Rash    Reaction to some types of tape   Follow-up Information    [facility]. Call.   Contact information: [address] [place] [address] [phone]        [facility]. Call.   Contact information: [address] [address] [place] [address] [phone]       Kathi Der, MD. Schedule an appointment as soon as possible for a visit in 6 week(s).   Specialty:  Gastroenterology Why:  Follow-up for esophagitis and colon ulcers. Contact information: [address] [place] [address] [phone]            The results of significant diagnostics from this hospitalization (including imaging, microbiology, ancillary and laboratory) are listed below for reference.    Significant Diagnostic Studies: Dg Chest 2 View  Result Date: 04/13/2018 CLINICAL DATA:  Infiltrate of lung present on chest x-ray. EXAM: CHEST -  2 VIEW COMPARISON:  None. FINDINGS: Trachea is midline. Heart is at the upper limits of normal in size. There is a nodular density in the right upper lung zone. Probable scarring adjacent to the left heart border. Biapical pleural thickening. No pleural fluid. Blunting of left costophrenic angle appears chronic. IMPRESSION: Nodular density in the right upper lung zone. CT chest without contrast is recommended, as clinically indicated, as malignancy cannot be excluded. Electronically Signed   By: Leanna Battles M.D.   On: 04/13/2018 10:38  Ct Head Wo Contrast  Result Date: 04/09/2018 CLINICAL DATA:  Altered level of consciousness. EXAM: CT HEAD WITHOUT CONTRAST TECHNIQUE: Contiguous axial images were obtained from the base of the skull through the vertex without intravenous contrast. COMPARISON:  Head CT 02/21/2016 FINDINGS: Brain: Unchanged general atrophy, advanced for age. Mild chronic small vessel ischemia. No intracranial hemorrhage, mass effect, or midline shift. No hydrocephalus. The basilar cisterns are patent. No evidence of territorial infarct or acute ischemia. No extra-axial or intracranial fluid collection. Vascular: No hyperdense vessel or unexpected calcification. Skull: No fracture or focal lesion. Sinuses/Orbits: Right maxillary sinus mucosal thickening, new from prior. Other: None. IMPRESSION: 1.  No acute intracranial abnormality. 2. Stable generalized atrophy, age advanced. 3. Right maxillary sinus inflammation. Electronically Signed   By: Rubye Oaks M.D.   On: 04/09/2018 23:19   Ct Abdomen Pelvis W Contrast  Result Date: 05/02/2018 CLINICAL DATA:  Upper abdominal pain.  Rectal bleeding. EXAM: CT ABDOMEN AND PELVIS WITH CONTRAST TECHNIQUE: Multidetector CT imaging of the abdomen and pelvis was performed using the standard protocol following bolus administration of intravenous contrast. CONTRAST:  OMNIPAQUE IOHEXOL 300 MG/ML  SOLN COMPARISON:  None. FINDINGS: Lower chest: Lung  bases are clear. Hepatobiliary: No focal hepatic lesion. No biliary duct dilatation. Gallbladder is normal. Common bile duct is normal. Pancreas: Pancreas is normal. No ductal dilatation. No pancreatic inflammation. Spleen: Normal spleen Adrenals/urinary tract: Adrenal glands and kidneys are normal. The ureters and bladder normal. Stomach/Bowel: Stomach, small bowel, appendix, and cecum are normal. There is mild diffuse mucosal thickening in the ascending colon. No pericolonic inflammation. No diverticular disease this region. No discrete mass lesion. Finding best seen on sagittal image 21/7. The transverse and descending colon normal. Rectosigmoid colon appears normal. No pericolonic adenopathy Vascular/Lymphatic: Abdominal aorta is normal caliber. No periportal or retroperitoneal adenopathy. No pelvic adenopathy. Reproductive: Prostate normal Other: No free fluid. Musculoskeletal: No aggressive osseous lesion. IMPRESSION: 1. Mild mucosal thickening in the ascending colon is nonspecific. Finding could represent segmental colitis. 2. No additional abnormality of the bowel identified in otherwise normal scan. Electronically Signed   By: Genevive Bi M.D.   On: 05/02/2018 18:23   Dg Chest Portable 1 View  Result Date: 04/10/2018 CLINICAL DATA:  Initial evaluation for acute cough. EXAM: PORTABLE CHEST 1 VIEW COMPARISON:  None available. FINDINGS: Cardiac and mediastinal silhouettes are within normal limits. Lungs are hypoinflated. Hazy opacity within the left mid and lower lung, suspicious for possible infiltrates given provided history of cough. Mild perihilar vascular congestion without pulmonary edema. No pleural effusion. No pneumothorax. No acute osseus abnormality. Sequelae of prior ORIF noted at the right clavicle. IMPRESSION: Hazy opacities within the left mid and lower lung, suspicious for possible infiltrates given the provided history of cough. Electronically Signed   By: Rise Mu M.D.    On: 04/10/2018 02:41    Microbiology: No results found for this or any previous visit (from the past 240 hour(s)).   Labs: Basic Metabolic Panel: Recent Labs  Lab 05/02/18 1600 05/03/18 0351 05/04/18 0504 05/05/18 0703  NA 138 141 137 136  K 4.6 4.3 3.9 3.8  CL 106 107 101 100*  CO2 GLUCOSE 93 92 85 87  BUN 15 7 5* <5*  CREATININE 0.80 0.91 0.75 0.79  CALCIUM 8.7* 8.7* 9.1 9.0   Liver Function Tests: Recent Labs  Lab 05/02/18 1600 05/03/18 0351  AST 28 19  ALT 29 25  ALKPHOS 41 45  BILITOT  0.4 0.5  PROT 6.4* 6.7  ALBUMIN 3.1* 2.9*   Recent Labs  Lab 05/02/18 1600  LIPASE 33   No results for input(s): AMMONIA in the last 168 hours. CBC: Recent Labs  Lab 05/03/18 1125 05/03/18 1707 05/04/18 0504 05/04/18 2006 05/05/18 0703  WBC 16.8* 12.9* 11.6* 9.8 8.1  NEUTROABS  --   --  7.4  --   --   HGB 8.5* 7.6* 8.5* 8.0* 8.2*  HCT 27.9* 25.4* 28.5* 26.6* 27.2*  MCV 79.7 79.1 78.9 78.9 79.3  PLT 442* 443* 472* 433* 422*   Cardiac Enzymes: No results for input(s): CKTOTAL, CKMB, CKMBINDEX, TROPONINI in the last 168 hours. BNP: BNP (last 3 results) No results for input(s): BNP in the last 8760 hours.  ProBNP (last 3 results) No results for input(s): PROBNP in the last 8760 hours.  CBG: No results for input(s): GLUCAP in the last 168 hours.     Signed:  Zannie Cove MD.  [facility] 05/05/2018, 10:43 AM

## 2018-05-05 NOTE — Brief Op Note (Signed)
05/02/2018 - 05/05/2018  9:02 AM  PATIENT:  Samuel Barrett  47 y.o. male  PRE-OPERATIVE DIAGNOSIS:  Rectal bleeding, abnormal CT scan  POST-OPERATIVE DIAGNOSIS:  Ascending colon ulcers   PROCEDURE:  Procedure(s): COLONOSCOPY WITH PROPOFOL (N/A)  SURGEON:  Surgeon(s) and Role:    * Jannie Doyle, MD - Primary  Findings ---------- - Colonoscopy showed multiple scattered ulcers in the ascending colon. Most likely NSAIDs induced. Normal terminal ileum. Biopsies taken  Recommendations --------------------------- - DC antibiotics. - Start regular diet. - Okay to discharge home from GI standpoint. - Follow-up in GI clinic in 6 weeks.Patient will need repeat EGD in 8 weeks to document healing of esophagitis. Repeat colonoscopy to be determined based on pathology findings. - GI will sign off. Call us back if needed  Kathi Der MD, FACP 05/05/2018, 9:04 AM  Contact #  [phone]

## 2018-05-05 NOTE — Progress Notes (Signed)
Samuel Barrett is a 47 y.o. male has presented with GI bleed, rectal bleeding, abnormal CT scan  The various methods of treatment have been discussed with the patient and family. After consideration of risks, benefits and other options for treatment, the patient has consented to  Procedure(s):colonoscopy as a surgical intervention .  The patient's history has been reviewed, patient examined, no change in status, stable for surgery.  I have reviewed the patient's chart and labs.  Questions were answered to the patient's satisfaction.   Risks (bleeding, infection, bowel perforation that could require surgery, sedation-related changes in cardiopulmonary systems), benefits (identification and possible treatment of source of symptoms, exclusion of certain causes of symptoms), and alternatives (watchful waiting, radiographic imaging studies, empiric medical treatment)  were explained to patient in detail and patient wishes to proceed.  Kathi Der MD, FACP 05/05/2018, 8:29 AM  Contact #  [phone]

## 2018-05-05 NOTE — Anesthesia Preprocedure Evaluation (Signed)
Anesthesia Evaluation  Patient identified by MRN, date of birth, ID band Patient awake    Reviewed: Allergy & Precautions, NPO status , Patient's Chart, lab work & pertinent test results  Airway Mallampati: I  TM Distance: >3 FB Neck ROM: Full    Dental   Pulmonary Current Smoker,    Pulmonary exam normal        Cardiovascular Normal cardiovascular exam     Neuro/Psych Anxiety Depression Bipolar Disorder    GI/Hepatic GERD  Medicated and Controlled,  Endo/Other    Renal/GU      Musculoskeletal   Abdominal   Peds  Hematology   Anesthesia Other Findings   Reproductive/Obstetrics                             Anesthesia Physical Anesthesia Plan  ASA: III  Anesthesia Plan: MAC   Post-op Pain Management:    Induction: Intravenous  PONV Risk Score and Plan: Treatment may vary due to age or medical condition  Airway Management Planned: Simple Face Mask  Additional Equipment:   Intra-op Plan:   Post-operative Plan:   Informed Consent: I have reviewed the patients History and Physical, chart, labs and discussed the procedure including the risks, benefits and alternatives for the proposed anesthesia with the patient or authorized representative who has indicated his/her understanding and acceptance.     Plan Discussed with: CRNA and Surgeon  Anesthesia Plan Comments:         Anesthesia Quick Evaluation

## 2018-05-05 NOTE — Progress Notes (Signed)
Pt gone to ENDO for EGD via wheelchair. Pt alert and oriented. VSS. Denies pain. Pleasant.

## 2018-05-05 NOTE — Anesthesia Postprocedure Evaluation (Signed)
Anesthesia Post Note  Patient: Samuel Barrett  Procedure(s) Performed: COLONOSCOPY WITH PROPOFOL (N/A )     Patient location during evaluation: PACU Anesthesia Type: MAC Level of consciousness: awake and alert Pain management: pain level controlled Vital Signs Assessment: post-procedure vital signs reviewed and stable Respiratory status: spontaneous breathing, nonlabored ventilation, respiratory function stable and patient connected to nasal cannula oxygen Cardiovascular status: stable and blood pressure returned to baseline Postop Assessment: no apparent nausea or vomiting Anesthetic complications: no    Last Vitals:  Vitals:   05/05/18 0914 05/05/18 0939  BP: (!) 126/91 (!) 141/101  Pulse: 86 91  Resp: (!) 23 18  Temp:  36.8 C  SpO2: 97% 100%    Last Pain:  Vitals:   05/05/18 1035  TempSrc:   PainSc: 0-No pain                 Kelvin Burpee DAVID

## 2018-05-08 ENCOUNTER — Encounter (HOSPITAL_COMMUNITY): Payer: Self-pay | Admitting: Gastroenterology

## 2018-06-29 DEATH — deceased
